# Patient Record
Sex: Female | Born: 1935 | Race: White | Hispanic: No | Smoking: Former smoker
Health system: Southern US, Community
[De-identification: ages and names within clinical notes are randomized; demographics above are authoritative.]

## PROBLEM LIST (undated history)

## (undated) DIAGNOSIS — M1990 Unspecified osteoarthritis, unspecified site: Secondary | ICD-10-CM

## (undated) DIAGNOSIS — I10 Essential (primary) hypertension: Secondary | ICD-10-CM

## (undated) DIAGNOSIS — E7800 Pure hypercholesterolemia, unspecified: Secondary | ICD-10-CM

## (undated) DIAGNOSIS — K219 Gastro-esophageal reflux disease without esophagitis: Secondary | ICD-10-CM

## (undated) HISTORY — PX: EYE SURGERY: SHX253

## (undated) HISTORY — PX: APPENDECTOMY: SHX54

## (undated) HISTORY — PX: TOTAL SHOULDER ARTHROPLASTY: SHX126

## (undated) HISTORY — PX: CHOLECYSTECTOMY: SHX55

---

## 2012-01-11 ENCOUNTER — Ambulatory Visit: Payer: Self-pay | Admitting: Internal Medicine

## 2012-02-02 ENCOUNTER — Encounter: Payer: Self-pay | Admitting: Internal Medicine

## 2012-02-16 ENCOUNTER — Encounter: Payer: Self-pay | Admitting: Internal Medicine

## 2012-05-18 ENCOUNTER — Inpatient Hospital Stay: Payer: Self-pay | Admitting: Internal Medicine

## 2012-05-18 LAB — URINALYSIS, COMPLETE
Bilirubin,UR: NEGATIVE
Blood: NEGATIVE
Glucose,UR: NEGATIVE mg/dL (ref 0–75)
Leukocyte Esterase: NEGATIVE
Nitrite: NEGATIVE
Protein: NEGATIVE
Specific Gravity: 1.01 (ref 1.003–1.030)
Squamous Epithelial: NONE SEEN

## 2012-05-18 LAB — BASIC METABOLIC PANEL
Anion Gap: 9 (ref 7–16)
BUN: 24 mg/dL — ABNORMAL HIGH (ref 7–18)
Calcium, Total: 9.1 mg/dL (ref 8.5–10.1)
Chloride: 105 mmol/L (ref 98–107)
Creatinine: 0.77 mg/dL (ref 0.60–1.30)
EGFR (African American): >60
EGFR (Non-African Amer.): >60
Glucose: 106 mg/dL — ABNORMAL HIGH (ref 65–99)
Osmolality: 284 (ref 275–301)

## 2012-05-18 LAB — CBC
HGB: 12.7 g/dL (ref 12.0–16.0)
MCH: 30.9 pg (ref 26.0–34.0)
MCV: 90 fL (ref 80–100)
RBC: 4.12 10*6/uL (ref 3.80–5.20)
WBC: 5.7 10*3/uL (ref 3.6–11.0)

## 2012-05-18 LAB — TROPONIN I: Troponin-I: <0.02 ng/mL

## 2012-05-18 LAB — CK TOTAL AND CKMB (NOT AT ARMC): CK-MB: 1.3 ng/mL (ref 0.5–3.6)

## 2012-05-19 ENCOUNTER — Ambulatory Visit: Payer: Self-pay | Admitting: Neurology

## 2012-05-19 LAB — BASIC METABOLIC PANEL
BUN: 21 mg/dL — ABNORMAL HIGH (ref 7–18)
Chloride: 109 mmol/L — ABNORMAL HIGH (ref 98–107)
Co2: 27 mmol/L (ref 21–32)
Creatinine: 0.67 mg/dL (ref 0.60–1.30)
EGFR (Non-African Amer.): >60
Osmolality: 290 (ref 275–301)
Potassium: 3.6 mmol/L (ref 3.5–5.1)
Sodium: 144 mmol/L (ref 136–145)

## 2012-05-19 LAB — CBC WITH DIFFERENTIAL/PLATELET
Basophil #: 0 10*3/uL (ref 0.0–0.1)
Basophil %: 0.4 %
Eosinophil #: 0.1 10*3/uL (ref 0.0–0.7)
Eosinophil %: 0.8 %
HCT: 34.8 % — ABNORMAL LOW (ref 35.0–47.0)
HGB: 12 g/dL (ref 12.0–16.0)
Lymphocyte #: 1.4 10*3/uL (ref 1.0–3.6)
Lymphocyte %: 17.3 %
MCHC: 34.4 g/dL (ref 32.0–36.0)
MCV: 91 fL (ref 80–100)
Neutrophil #: 5.5 10*3/uL (ref 1.4–6.5)
Neutrophil %: 69.3 %
RDW: 12.9 % (ref 11.5–14.5)

## 2012-05-19 LAB — CK TOTAL AND CKMB (NOT AT ARMC)
CK, Total: 139 U/L (ref 21–215)
CK-MB: 1.8 ng/mL (ref 0.5–3.6)

## 2012-05-19 LAB — LIPID PANEL
Ldl Cholesterol, Calc: 112 mg/dL — ABNORMAL HIGH (ref 0–100)
Triglycerides: 103 mg/dL (ref 0–200)
VLDL Cholesterol, Calc: 21 mg/dL (ref 5–40)

## 2012-05-19 LAB — TROPONIN I: Troponin-I: <0.02 ng/mL

## 2012-05-30 ENCOUNTER — Ambulatory Visit: Payer: Self-pay | Admitting: Internal Medicine

## 2012-10-05 ENCOUNTER — Ambulatory Visit: Payer: Self-pay | Admitting: Internal Medicine

## 2013-01-02 ENCOUNTER — Other Ambulatory Visit: Payer: Self-pay | Admitting: Internal Medicine

## 2013-01-02 LAB — COMPREHENSIVE METABOLIC PANEL
Albumin: 4.1 g/dL (ref 3.4–5.0)
Alkaline Phosphatase: 53 U/L (ref 50–136)
Anion Gap: 2 — ABNORMAL LOW (ref 7–16)
Chloride: 108 mmol/L — ABNORMAL HIGH (ref 98–107)
Co2: 32 mmol/L (ref 21–32)
Creatinine: 0.75 mg/dL (ref 0.60–1.30)
Glucose: 102 mg/dL — ABNORMAL HIGH (ref 65–99)
Osmolality: 287 (ref 275–301)
Potassium: 3.7 mmol/L (ref 3.5–5.1)
SGPT (ALT): 18 U/L (ref 12–78)

## 2013-01-02 LAB — CBC WITH DIFFERENTIAL/PLATELET
Basophil #: 0 10*3/uL (ref 0.0–0.1)
Basophil %: 0.6 %
Eosinophil %: 2.9 %
HCT: 37.4 % (ref 35.0–47.0)
Lymphocyte #: 1.7 10*3/uL (ref 1.0–3.6)
Lymphocyte %: 25.8 %
MCH: 31.2 pg (ref 26.0–34.0)
MCV: 90 fL (ref 80–100)
Neutrophil #: 4.1 10*3/uL (ref 1.4–6.5)

## 2013-01-02 LAB — LIPID PANEL
Cholesterol: 169 mg/dL (ref 0–200)
Ldl Cholesterol, Calc: 96 mg/dL (ref 0–100)
VLDL Cholesterol, Calc: 20 mg/dL (ref 5–40)

## 2013-01-02 LAB — HEMOGLOBIN A1C: Hemoglobin A1C: 5.4 % (ref 4.2–6.3)

## 2013-07-22 ENCOUNTER — Other Ambulatory Visit: Payer: Self-pay | Admitting: Rheumatology

## 2013-07-22 LAB — SYNOVIAL CELL COUNT + DIFF, W/ CRYSTALS
Basophil: 0 %
Crystals, Joint Fluid: NONE SEEN
EOS PCT: 0 %
LYMPHS PCT: 3 %
Neutrophils: 88 %
Nucleated Cell Count: 4209 /mm3
Other Cells BF: 0 %
Other Mononuclear Cells: 9 %

## 2014-11-04 NOTE — Consult Note (Signed)
Brief Consult Note: Diagnosis: Left distal radius fracture; Right distal radius fracture.   Patient was seen by consultant.   Consult note dictated.   Comments: Continue with splints, ice, and elevation. Will repeat radiographs in 1 week.  Electronic Signatures: Donato HeinzHooten, James P (MD)  (Signed [phone]-Nov-13 21:54)  Authored: Brief Consult Note   Last Updated: 01-Nov-13 21:54 by Donato HeinzHooten, James P (MD)

## 2014-11-04 NOTE — H&P (Signed)
PATIENT NAME:  Pamela CalkinUGH, Verta MR#:  [id number] DATE OF BIRTH:  28-Apr-1926  DATE OF ADMISSION:  05/18/2012  PRIMARY CARE PHYSICIAN: Veneda MelterMaureen Andreassi, MD     REFERRING PHYSICIAN:   Governor Rooksebecca Lord, MD    CHIEF COMPLAINT: The patient was found facing down on the sidewalk with subsequent confusion here in the ED.   HISTORY OF PRESENT ILLNESS: The patient is an 81 year old white female who resides by herself. Her daughter is here at the bedside and reports that she is in a good state of health and is able to take care of herself, including she does her own cooking, cleaning, etc. Her only medical problems are that she has a history of osteoarthritis and was recently diagnosed with high blood pressure. She was in her normal state of health when her daughter called her last night. The patient apparently went outside today and was found on the concrete floor face down by her neighbors. She was brought to the ED, and she has since been confused. She is able to recall her daughter's name and her granddaughter's name, but she has other things that she is confused about.  She is not sure where she is. She is not sure of what year it is or what the date is.  She is denying any visual difficulties, no speech difficulties. She is denying any asymmetrical weakness, denies any numbness. The patient did have a CT scan of the head which was negative. There is no other eyewitness to tell us what exactly happened. The patient also has not been recently sick, according to the daughter, and has not complained of anything significant.   PAST MEDICAL HISTORY:  1. Osteoarthritis.  2. History of recently diagnosed hypertension.  3. Hyperlipidemia.  4. Osteoarthritis.   PAST SURGICAL HISTORY:  1. Titanium left shoulder replacement.  2. Status post cholecystectomy.  3. Status post tonsillectomy.  4. Status post right wrist surgery.  5. Status post cataract surgery.  ALLERGIES: None.   CURRENT MEDICATIONS: She is just on  medication for cholesterol, however, we do not have the name of the medication.   SOCIAL HISTORY: History of smoking, quit a long time ago. She drinks 1 or 2 beers yearly. No drug use.   FAMILY HISTORY: Unknown.   REVIEW OF SYSTEMS: The patient is currently confused, unable to obtain any review of systems.   PHYSICAL EXAMINATION:  VITAL SIGNS: Temperature 98, pulse 74, respirations 18, blood pressure 162/65, O2 97%.   GENERAL: The patient is a thin Caucasian female currently not in any acute distress.   HEENT: She has some laceration on her forehead and laceration under her chin. Left pupil, she has a lens in place and is not reactive. Right pupil is equally round, reactive to light and accommodation. Extraocular movements are intact. There is no conjunctival pallor. No scleral icterus. Nasal exam shows no drainage or ulceration. Oropharynx is clear without any exudate.   NECK: No thyromegaly. No carotid bruits.   CARDIOVASCULAR: Regular rate and rhythm. No murmurs, rubs, clicks, or gallops. PMI is not displaced.   LUNGS: Clear to auscultation bilaterally without any rales, rhonchi, or wheezing.   ABDOMEN: Soft, nontender, nondistended. Positive bowel sounds x4.   EXTREMITIES: No clubbing, cyanosis, or edema.   SKIN: No rash.   LYMPHATICS: No lymph nodes palpable.   VASCULAR: Good DP pulses.  PSYCHIATRIC: The patient is not anxious or depressed.   NEUROLOGICAL: Cranial nerves II through XII are grossly intact. Strength is five out of five  in all four extremities, although because of the splint to the left upper extremity she is not able to squeeze my hand but is able to lift her arm. Lower extremities: She has got also five out of five strength. Reflexes are 2+. Babinski's are downgoing. Sensation is intact.   LABORATORY, DIAGNOSTIC AND RADIOLOGICAL DATA: In the ED so far: Her WBC count is 5.7, hemoglobin 12.7, platelet count 135. Troponin less than 0.02. CPK 49. CK-MB 1.3. BMP:  Glucose 106, BUN 24, creatinine 0.77, sodium 140, potassium 3.8, chloride 105, CO2 26. Urinalysis: Nitrites negative, leukocytes negative. Left wrist shows distal radius fracture, age indeterminate. CT scan of the C-spine negative for any abnormalities. CT scan of the head without contrast showed no evidence of acute abnormality. Diffuse mild area of low attenuation within the subcortical deep and periventricular white matter regions.   ASSESSMENT AND PLAN: The patient is an 81 year old white female with a history of hyperlipidemia, recent diagnosis of hypertension, was found on the floor by her neighbor and now has some confusion.   1. Loss of consciousness: Differential diagnosis includes possible CVA. We will go ahead and get an MRI of the brain although she does not have any other significant deficits on physical exam.  There is also concern for possible syncope. If it was syncope, her mental status should have returned back to normal. However, we will place her on telemetry for further evaluation. We will also get an echocardiogram of the heart. Other differentials also include possible arrhythmias. We will monitor her on telemetry for any evidence of any arrhythmias. Other differential includes possible seizure. We will get an EEG and Neurology evaluation.  2. Hypertension: Blood pressure is currently elevated. Due to possible CVA, we will allow permissive hypertension.  3. Hyperlipidemia: We will start her on Lipitor, check a fasting lipid panel in the a.m.  4. Left distal radius fracture, currently splinted in place: We will ask Orthopedics for further recommendations.  5. Prophylaxis: I will place her on Lovenox for deep vein thrombosis prophylaxis.   TIME SPENT:   35 minutes. ____________________________ Lacie Scotts Allena Katz, MD shp:cbb D: 05/18/2012 17:00:00 ET T: 05/18/2012 17:17:50 ET JOB#: [id number] cc: Marisue Humble P. Alison Murray, MD Charise Carwin MD ELECTRONICALLY SIGNED 05/22/2012 17:29

## 2014-11-04 NOTE — Consult Note (Signed)
PATIENT NAME:  Pamela Shaffer, Pamela Shaffer MR#:  [id number] DATE OF BIRTH:  Apr 18, 1926  DATE OF CONSULTATION:  05/19/2012  REFERRING PHYSICIAN:   CONSULTING PHYSICIAN:  Pauletta Browns, MD  HISTORY OF PRESENT ILLNESS: This is a pleasant 81 year old female. Information was obtained from the patient and the patient's daughter and granddaughter who are present at bedside. The patient was in good health, living by herself, and was admitted yesterday post-fall.  Her only past medical history includes osteoarthritis and recently diagnosed hypertension for which she was started on medications for.  The patient was in normal health yesterday, states she woke up, did not eat her breakfast, but she usually does not eat her breakfast, and remembers sometime in the afternoon she had to send out two checks. She remembers signing those checks, putting them in envelopes, then she states she tried to go outside and send those checks out. The next thing she remembers was being at the hospital. The patient's daughter states that the patient was found down on the ground by the patient's neighbor. It was unknown the duration of her being found down. The patient does not report any signs prior to the fall, no reports of dizziness, no reports of confusion, headaches. There was no report of seizure-like activity by the patient's neighbors. There was no urinary incontinence and there was no tongue biting. As per the patient's family, it appears that the patient's current medical condition and her mental status are back to her baseline despite her bilateral wrist fracture and hematoma around her left eye. There was no prior history of similar falls in the past.  PAST MEDICAL HISTORY:  1. Osteoarthritis. 2. Recently diagnosed hypertension. 3. Hyperlipidemia.  PAST SURGICAL HISTORY:  1. Right wrist surgery status post tonsillectomy. 2. Left shoulder replacement. 3. Cataract surgery.  DRUG ALLERGIES: No known drug allergies.   MEDICATIONS:  Medication for hypercholesterolemia and hypertension.  SOCIAL HISTORY: No recent smoking history. Occasional beer drinker. Lives alone. Able to clean, cook, dress herself without any assistance and talks to family almost daily by telephone.   PHYSICAL EXAMINATION:   VITALS: Temperature 98, blood pressure 163/65, pulse oximetry 92%.  NEURO: The patient is alert, awake, and oriented to time, place, and location. She was able to tell me the president of the [place], the [address] of the [place], and the [address] of [place]. She was able to do serial sevens. She was able to recall three objects within 10 seconds and two out of three within 3 minutes. She was able to name different objects that I was showing her. On cranial nerve examination, extraocular movements are intact. The face appears to have very minimal left facial droop, but according to the family this is chronic. No hearing deficits. Tongue is midline. Palate elevates symmetrically. On motor examination, tone is within normal limits. Strength appears to be 5 out of 5 bilateral in upper and lower extremities. Sensation is intact to light touch and pain as well as temperature. Finger to nose intact. No coordination issues. Gait not assessed.   LABS: In reviewing her labs, her white blood cell count is 7.9, hemoglobin and hematocrit 12 and 34.8, and platelet count 129. The rest of her electrolytes such as sodium is 144, potassium 3.6, BUN 21, and creatinine 0.67. Glucose is 1.9. Troponin was negative.   IMAGING: The patient had CAT scan of the head that did not show any acute abnormalities.   She had an ultrasound of the carotids that did not show any  hemodynamic stenosis.   MRI of the brain was performed and on preliminary results there is no acute ischemia. Multiple T2 flair white matter changes that are consistent with hypertension.   An electroencephalogram was done which did not show seizure activity.    ASSESSMENT: An 81 year old Caucasian female with history of hyperlipidemia, osteoarthritis, and newly diagnosed hypertension status post fall with suspected loss of consciousness found down by her neighbor for undetermined period of time. The patient appears to be at her baseline right now and she is found to have bilateral wrist fractures for which she is in splints. She also has mild left periorbital swelling and some hematoma.  At this point, stroke has been ruled out. EEG was done and she is less likely to have suspected seizure activity because there was no witnessed shaking activity, the patient did not have any tongue biting, urinary incontinence, and EEG that was performed is completely normal.   RECOMMENDATIONS/PLAN: I would not recommend any further neurological workup at this time, but would recommend cardiovascular workup such as echocardiogram, EKG, and Holter monitor to look for any possible arrhythmias such as atrial fibrillation and continue telemetry monitoring. If such symptoms were to reoccur, and neurological workup is negative, would like to have prolonged EEG monitoring and possibly start her on an antiepileptic medication. Thank you. It was a pleasure seeing this patient.  ____________________________ Pauletta BrownsYuriy Tyrae Alcoser, MD yz:slb D: 05/19/2012 15:02:00 ET T: 05/19/2012 15:12:26 ET JOB#: [id number]  cc: Pauletta BrownsYuriy Fontaine Kossman, MD, <Dictator> Pauletta BrownsYURIY Bonnell Placzek MD ELECTRONICALLY SIGNED 07/03/2012 19:07

## 2014-11-04 NOTE — Discharge Summary (Signed)
PATIENT NAME:  Pamela CalkinUGH, [name] MR#:  [id number] DATE OF BIRTH:  [birth year]  DATE OF ADMISSION:  05/18/2012 DATE OF DISCHARGE:  05/20/2012  DIAGNOSES:  1. Syncope versus mechanical fall.  2. Hypertension.  3. Hyperlipidemia.  4. Bilateral radial fractures.   DISPOSITION: The patient is being discharged home with home health.   DIET: Low sodium.   ACTIVITY: As tolerated.   FOLLOWUP: Follow up with Dr. Ernest PineHooten in 1 week of discharge. Follow up with PCP Dr. Alison MurrayAndreassi in 1 to 2 weeks after discharge.   DISCHARGE MEDICATIONS:  1. Lovastatin 20 mg daily.  2. Meloxicam 7.5 mg daily.  3. P.r.n. Percocet 5/325 one tablet q.6 hours p.r.n.   CONSULTATIONS:  1. Neurology consultation with Dr. Loretha BrasilZeylikman. 2. Orthopedic consultation with Dr. Ernest PineHooten.  RESULTS: MRI of the brain showed no acute abnormalities. Carotid ultrasound showed no hemodynamically significant stenosis. CAT scan of the head showed no acute problems. Bilateral wrist x-ray showed bilateral radial fractures. Echocardiogram: Normal LV function, EF more than 55%, normal left ventricular wall thickness, left ventricular wall function is normal. Urinalysis showed no evidence of infection. CBC normal other than platelet count of 135,000. Complete metabolic panel normal. Cardiac enzymes normal.   HOSPITAL COURSE: The patient is an 81 year old female with history of hypertension not on any medications, hyperlipidemia, who was walking to post her letters when she apparently either tripped and fell and hit her head or passed out. The patient was found by her neighbor who called EMS. The etiology was unclear. Differential diagnosis includes CVA which was ruled out by negative CT head and MRI of the brain. Carotid ultrasound also showed no stenosis. EEG showed no seizure activity. Cardiac enzymes were checked and were normal.  On telemetry patient had no arrhythmias. A neurology consultation was obtained, and no further intervention was recommended for  the time being. The patient has had a history of hypertension and she reported that stress causes her blood pressure to go up but her blood pressure was overall very variable. Therefore her PCP has decided not to start her on any medications. She has a history of hyperlipidemia. Her LDL was 112. She is on a statin therapy. The patient was found to have bilateral radial fractures and was evaluated by Dr. Ernest PineHooten who placed her wrists in a splint. He also recommended ice and elevation. Patient will follow up with Dr. Ernest PineHooten within one week to have follow-up x-rays and possible cast placement. Home health RN and aide have been arranged for the patient. She is being discharged home in a stable condition.   TIME SPENT: 45 minutes.    ____________________________ Darrick MeigsSangeeta Iliyah Bui, MD sp:vtd D: 05/21/2012 13:55:27 ET T: 05/21/2012 14:05:51 ET JOB#: [id number]  cc: Darrick MeigsSangeeta Tammera Engert, MD, <Dictator> Maureen P. Alison MurrayAndreassi, MD Darrick MeigsSANGEETA Vearl Aitken MD ELECTRONICALLY SIGNED 05/21/2012 14:25

## 2014-11-04 NOTE — Consult Note (Signed)
PATIENT NAME:  Pamela Shaffer, Pamela Shaffer MR#:  [id number] DATE OF BIRTH:  05-05-1926  DATE OF CONSULTATION:  05/18/2012  REFERRING PHYSICIAN:  Auburn BilberryShreyang Patel, MD CONSULTING PHYSICIAN:  Illene LabradorJames P. Angie FavaHooten Jr., MD  CHIEF COMPLAINT: Bilateral wrist pain.   HISTORY OF PRESENT ILLNESS: The patient is an 81 year old female who apparently went outside earlier today and sustained a fall. She was found on the ground facedown by one of her neighbors. The patient does not recall the events of the fall. She was noted to be confused at the time of presentation and has continued to the demonstrate some confusion since admission. She complained of some bilateral wrist pain. Radiographs obtained in the emergency department demonstrated findings consistent with bilateral distal radius fractures. Splints were applied in the emergency department.   PAST MEDICAL HISTORY:  1. Degenerative arthrosis. 2. Hypertension.  3. Hyperlipidemia.  4. Status post left shoulder arthroplasty.  5. Cholecystectomy.  6. Tonsillectomy. 7. Right wrist surgery. 8. Cataract surgery.   ALLERGIES: No known drug allergies.   CURRENT MEDICATIONS: Lovastatin 20 mg daily.   SOCIAL HISTORY: Remote history of tobacco use. Minimal use of alcohol. No drug use. The patient lives alone but has family locally.   FAMILY HISTORY: Noncontributory.   REVIEW OF SYSTEMS: The patient was confused and was unable to provide an appropriate review of systems.   PHYSICAL EXAMINATION:   GENERAL: The patient is an elderly, well developed well nourished female seen in no acute distress. She is awake and alert.   HEENT: Lacerations are noted to the head and chin. Some surrounding ecchymosis is noted. Oropharynx is clear.   NECK: Supple and nontender with good range of motion.   PULMONARY: Lungs are clear to auscultation bilaterally.   CARDIAC: Regular rate and rhythm. Good capillary refill was noted to the upper extremities.   EXTREMITIES: Short arm volar  splints are in place to both forearms. Some ecchymosis is noted along the base of the thumbs. Changes consistent with osteoarthritis of digits are noted bilaterally. There is tenderness to palpation about both wrists. Mild swelling of the digits is appreciated. Elbows are nontender to palpation. Good range of motion of both elbows is appreciated.   NEUROLOGIC: The patient is awake and alert, but somewhat confused. Sensory function is grossly intact. Motor strength is intact. Good motor coordination.   X-RAYS: [facility]. There is nondisplaced fracture of the distal radius. Good alignment was noted.   Radiographs of the left wrist were also reviewed. There is a comminuted fracture of the left distal radius. Some displacement is noted.   IMPRESSION:  1. Right distal radius fracture.  2. Left distal radius fracture, comminuted.   PLAN: Findings were discussed with the patient. I would agree with continuation of splint immobilization of both wrists. She is encouraged to work on range of motion of the digits.     Continue with cold therapy to minimize swelling. I anticipate repeating radiographs in approximately one week. I would anticipate converting the splints to casts after swelling has resolved.  ____________________________ Illene LabradorJames P. Angie FavaHooten Jr., MD jph:slb D: 05/18/2012 22:04:00 ET T: 05/19/2012 10:43:30 ET JOB#: [id number]  cc: Fayrene FearingJames P. Angie FavaHooten Jr., MD, <Dictator> Illene LabradorJAMES P Angie FavaHOOTEN JR MD ELECTRONICALLY SIGNED 05/20/2012 6:40

## 2015-07-06 ENCOUNTER — Other Ambulatory Visit: Payer: Self-pay | Admitting: Internal Medicine

## 2015-07-06 DIAGNOSIS — I6523 Occlusion and stenosis of bilateral carotid arteries: Secondary | ICD-10-CM

## 2015-07-09 ENCOUNTER — Ambulatory Visit
Admission: RE | Admit: 2015-07-09 | Discharge: 2015-07-09 | Disposition: A | Discharge status: Home / Self Care | Payer: Medicare Other | Source: Ambulatory Visit | Attending: Internal Medicine | Admitting: Internal Medicine

## 2015-07-09 DIAGNOSIS — I6523 Occlusion and stenosis of bilateral carotid arteries: Secondary | ICD-10-CM | POA: Diagnosis not present

## 2016-10-23 ENCOUNTER — Other Ambulatory Visit: Payer: Self-pay | Admitting: Specialist

## 2016-10-26 ENCOUNTER — Inpatient Hospital Stay: Admission: RE | Admit: 2016-10-26 | Payer: Medicare Other | Source: Ambulatory Visit

## 2016-10-28 ENCOUNTER — Encounter
Admission: RE | Admit: 2016-10-28 | Discharge: 2016-10-28 | Disposition: A | Discharge status: Home / Self Care | Payer: Medicare Other | Source: Ambulatory Visit | Attending: Specialist | Admitting: Specialist

## 2016-10-28 ENCOUNTER — Other Ambulatory Visit: Payer: Self-pay | Admitting: Specialist

## 2016-10-28 DIAGNOSIS — M1711 Unilateral primary osteoarthritis, right knee: Secondary | ICD-10-CM | POA: Insufficient documentation

## 2016-10-28 DIAGNOSIS — Z0183 Encounter for blood typing: Secondary | ICD-10-CM | POA: Insufficient documentation

## 2016-10-28 DIAGNOSIS — Z01812 Encounter for preprocedural laboratory examination: Secondary | ICD-10-CM | POA: Diagnosis present

## 2016-10-28 HISTORY — DX: Pure hypercholesterolemia, unspecified: E78.00

## 2016-10-28 HISTORY — DX: Essential (primary) hypertension: I10

## 2016-10-28 HISTORY — DX: Unspecified osteoarthritis, unspecified site: M19.90

## 2016-10-28 HISTORY — DX: Gastro-esophageal reflux disease without esophagitis: K21.9

## 2016-10-28 LAB — URINALYSIS, COMPLETE (UACMP) WITH MICROSCOPIC
BACTERIA UA: NONE SEEN
BILIRUBIN URINE: NEGATIVE
Glucose, UA: NEGATIVE mg/dL
HGB URINE DIPSTICK: NEGATIVE
KETONES UR: NEGATIVE mg/dL
LEUKOCYTES UA: NEGATIVE
NITRITE: NEGATIVE
Protein, ur: NEGATIVE mg/dL
SPECIFIC GRAVITY, URINE: 1.006 (ref 1.005–1.030)
Squamous Epithelial / LPF: NONE SEEN
pH: 6 (ref 5.0–8.0)

## 2016-10-28 LAB — DIFFERENTIAL
BASOS PCT: 1 %
Basophils Absolute: 0 10*3/uL (ref 0–0.1)
EOS ABS: 0.1 10*3/uL (ref 0–0.7)
Eosinophils Relative: 2 %
Lymphocytes Relative: 18 %
Lymphs Abs: 1.4 10*3/uL (ref 1.0–3.6)
MONO ABS: 0.5 10*3/uL (ref 0.2–0.9)
MONOS PCT: 7 %
NEUTROS ABS: 5.4 10*3/uL (ref 1.4–6.5)
Neutrophils Relative %: 72 %

## 2016-10-28 LAB — CBC
HEMATOCRIT: 39.3 % (ref 35.0–47.0)
HEMOGLOBIN: 13.3 g/dL (ref 12.0–16.0)
MCH: 30.3 pg (ref 26.0–34.0)
MCHC: 33.8 g/dL (ref 32.0–36.0)
MCV: 89.7 fL (ref 80.0–100.0)
PLATELETS: 191 10*3/uL (ref 150–440)
RBC: 4.38 MIL/uL (ref 3.80–5.20)
RDW: 14 % (ref 11.5–14.5)
WBC: 7.5 10*3/uL (ref 3.6–11.0)

## 2016-10-28 LAB — TYPE AND SCREEN
ABO/RH(D): A POS
ANTIBODY SCREEN: NEGATIVE

## 2016-10-28 LAB — BASIC METABOLIC PANEL
Anion gap: 7 (ref 5–15)
BUN: 19 mg/dL (ref 6–20)
CALCIUM: 9.4 mg/dL (ref 8.9–10.3)
CO2: 28 mmol/L (ref 22–32)
CREATININE: 0.55 mg/dL (ref 0.44–1.00)
Chloride: 105 mmol/L (ref 101–111)
GFR calc Af Amer: >60 mL/min (ref >60)
GLUCOSE: 115 mg/dL — AB (ref 65–99)
POTASSIUM: 3.8 mmol/L (ref 3.5–5.1)
SODIUM: 140 mmol/L (ref 135–145)

## 2016-10-28 LAB — PROTIME-INR
INR: 0.97
PROTHROMBIN TIME: 12.9 s (ref 11.4–15.2)

## 2016-10-28 LAB — SURGICAL PCR SCREEN
MRSA, PCR: NEGATIVE
STAPHYLOCOCCUS AUREUS: POSITIVE — AB

## 2016-10-28 NOTE — Patient Instructions (Signed)
  Your procedure is scheduled on: November 02, 2016 (Wednesday) Report to Same Day Surgery 2nd floor medical mall [facility] Entrance-take elevator on left to 2nd floor.  Check in with surgery information desk.) To find out your arrival time please call [phone] between 1PM - 3PM on November 01, 2016 (Tuesday)   Remember: Instructions that are not followed completely may result in serious medical risk, up to and including death, or upon the discretion of your surgeon and anesthesiologist your surgery may need to be rescheduled.    _x___ 1. Do not eat food or drink liquids after midnight. No gum chewing or hard candies                       __x__ 2. No Alcohol for 24 hours before or after surgery.   __x__3. No Smoking for 24 prior to surgery.   ____  4. Bring all medications with you on the day of surgery if instructed.    __x__ 5. Notify your doctor if there is any change in your medical condition     (cold, fever, infections).     Do not wear jewelry, make-up, hairpins, clips or nail polish.  Do not wear lotions, powders, or perfumes.  Do not shave 48 hours prior to surgery. Men may shave face and neck.  Do not bring valuables to the hospital.    [facility] is not responsible for any belongings or valuables.               Contacts, dentures or bridgework may not be worn into surgery.  Leave your suitcase in the car. After surgery it may be brought to your room.  For patients admitted to the hospital, discharge time is determined by your  treatment team                      Patients discharged the day of surgery will not be allowed to drive home.  You will need someone to drive you home and stay with you the night of your procedure.    Please read over the following fact sheets that you were given:   [facility] Preparing for Surgery and or MRSA Information   _x___ Take anti-hypertensive (unless it includes a diuretic), cardiac, seizure, asthma,     anti-reflux and psychiatric  medicines with a sip of water These include:  1. GABAPENTIN  2. OMEPRAZOLE  3.  4.  5.  6.  ____Fleets enema or Magnesium Citrate as directed.   _x___ Use CHG Soap or sage wipes as directed on instruction sheet   ____ Use inhalers on the day of surgery and bring to hospital day of surgery  ____ Stop Metformin and Janumet 2 days prior to surgery.    ____ Take 1/2 of usual insulin dose the night before surgery and none on the morning     surgery.   _x___ Follow recommendations from Cardiologist, Pulmonologist or PCP regarding          stopping Aspirin, Coumadin, Pllavix ,Eliquis, Effient, or Pradaxa, and Pletal.  X____Stop Anti-inflammatories such as Advil, Aleve, Ibuprofen, Motrin, Naproxen, Naprosyn, Goodies powders or aspirin products. OK to take Tylenol (STOP DICLOFENAC NOW)   _x___ Stop supplements until after surgery.  But may continue Vitamin D, Vitamin B,       and multivitamin.   ____ Bring C-Pap to the hospital.

## 2016-10-28 NOTE — Pre-Procedure Instructions (Signed)
Component Name Value Ref Range  Vent Rate (bpm) 81   PR Interval (msec) 188   QRS Interval (msec) 126   QT Interval (msec) 412   QTc (msec) 478   Result Narrative  Normal sinus rhythm Right bundle branch block Moderate voltage criteria for LVH, may be normal variant Abnormal ECG When compared with ECG of 23-Dec-2015 10:01, Nonspecific T wave abnormality has replaced inverted T waves in Inferior leads I reviewed and concur with this report. Electronically signed [id number] MD, Smitty Cords [phone]) on 09/26/2016 2:17:32 PM  Status Results Details

## 2016-10-29 LAB — URINE CULTURE

## 2016-10-31 NOTE — Pre-Procedure Instructions (Signed)
Positive Staph and Urine culture results faxed to Dr. Hyacinth Meeker office.

## 2016-11-01 MED ORDER — LACTATED RINGERS IV SOLN
INTRAVENOUS | Status: DC
  Administered 2016-11-02: 07:00:00 via INTRAVENOUS

## 2016-11-02 ENCOUNTER — Ambulatory Visit: Payer: Medicare Other | Admitting: Anesthesiology

## 2016-11-02 ENCOUNTER — Encounter
Admission: RE | Disposition: A | Discharge status: Home / Self Care | Payer: Self-pay | Source: Ambulatory Visit | Attending: Specialist

## 2016-11-02 ENCOUNTER — Inpatient Hospital Stay
Admission: RE | Admit: 2016-11-02 | Discharge: 2016-11-05 | DRG: 470 | Disposition: A | Discharge status: Home / Self Care | Payer: Medicare Other | Source: Ambulatory Visit | Attending: Specialist | Admitting: Specialist

## 2016-11-02 ENCOUNTER — Inpatient Hospital Stay: Payer: Medicare Other

## 2016-11-02 ENCOUNTER — Encounter: Payer: Self-pay | Admitting: *Deleted

## 2016-11-02 DIAGNOSIS — I10 Essential (primary) hypertension: Secondary | ICD-10-CM | POA: Diagnosis present

## 2016-11-02 DIAGNOSIS — Z87891 Personal history of nicotine dependence: Secondary | ICD-10-CM

## 2016-11-02 DIAGNOSIS — M1711 Unilateral primary osteoarthritis, right knee: Secondary | ICD-10-CM | POA: Diagnosis present

## 2016-11-02 DIAGNOSIS — K219 Gastro-esophageal reflux disease without esophagitis: Secondary | ICD-10-CM | POA: Diagnosis present

## 2016-11-02 DIAGNOSIS — Z96659 Presence of unspecified artificial knee joint: Secondary | ICD-10-CM

## 2016-11-02 HISTORY — PX: TOTAL KNEE ARTHROPLASTY: SHX125

## 2016-11-02 LAB — CBC
HCT: 32.6 % — ABNORMAL LOW (ref 35.0–47.0)
HEMOGLOBIN: 10.8 g/dL — AB (ref 12.0–16.0)
MCH: 29.8 pg (ref 26.0–34.0)
MCHC: 33 g/dL (ref 32.0–36.0)
MCV: 90.2 fL (ref 80.0–100.0)
PLATELETS: 157 10*3/uL (ref 150–440)
RBC: 3.61 MIL/uL — AB (ref 3.80–5.20)
RDW: 13.9 % (ref 11.5–14.5)
WBC: 7.9 10*3/uL (ref 3.6–11.0)

## 2016-11-02 LAB — CREATININE, SERUM
Creatinine, Ser: 0.74 mg/dL (ref 0.44–1.00)
GFR calc Af Amer: >60 mL/min (ref >60)
GFR calc non Af Amer: >60 mL/min (ref >60)

## 2016-11-02 LAB — ABO/RH: ABO/RH(D): A POS

## 2016-11-02 SURGERY — ARTHROPLASTY, KNEE, TOTAL
Anesthesia: Spinal | Site: Knee | Laterality: Right | Wound class: Clean

## 2016-11-02 MED ORDER — MORPHINE SULFATE (PF) 4 MG/ML IV SOLN
INTRAVENOUS | Status: DC | PRN
  Administered 2016-11-02: 4 mg

## 2016-11-02 MED ORDER — PROPOFOL 10 MG/ML IV BOLUS
INTRAVENOUS | Status: AC
  Filled 2016-11-02: qty 20

## 2016-11-02 MED ORDER — CALCIUM CARBONATE-VITAMIN D 500-200 MG-UNIT PO TABS
2.0000 | ORAL_TABLET | Freq: Every day | ORAL | Status: DC
  Administered 2016-11-03 – 2016-11-05 (×3): 2 via ORAL
  Filled 2016-11-02 (×3): qty 2

## 2016-11-02 MED ORDER — NALBUPHINE HCL 10 MG/ML IJ SOLN
5.0000 mg | INTRAMUSCULAR | Status: DC | PRN
  Filled 2016-11-02: qty 0.5

## 2016-11-02 MED ORDER — ACETAMINOPHEN 500 MG PO TABS
1000.0000 mg | ORAL_TABLET | Freq: Four times a day (QID) | ORAL | Status: AC
  Administered 2016-11-03 (×2): 1000 mg via ORAL
  Filled 2016-11-02 (×3): qty 2

## 2016-11-02 MED ORDER — SODIUM CHLORIDE 0.9 % IV SOLN
INTRAVENOUS | Status: DC | PRN
  Administered 2016-11-02: 60 mL

## 2016-11-02 MED ORDER — NALBUPHINE HCL 10 MG/ML IJ SOLN
5.0000 mg | Freq: Once | INTRAMUSCULAR | Status: DC | PRN
  Filled 2016-11-02: qty 0.5

## 2016-11-02 MED ORDER — SENNA 8.6 MG PO TABS
1.0000 | ORAL_TABLET | Freq: Two times a day (BID) | ORAL | Status: DC
  Administered 2016-11-02 – 2016-11-05 (×6): 8.6 mg via ORAL
  Filled 2016-11-02 (×6): qty 1

## 2016-11-02 MED ORDER — DIPHENHYDRAMINE HCL 12.5 MG/5ML PO ELIX: 12.5000 mg | ORAL_SOLUTION | ORAL | Status: DC | PRN

## 2016-11-02 MED ORDER — DEXAMETHASONE SODIUM PHOSPHATE 10 MG/ML IJ SOLN
INTRAMUSCULAR | Status: AC
  Filled 2016-11-02: qty 1

## 2016-11-02 MED ORDER — SODIUM CHLORIDE FLUSH 0.9 % IV SOLN
INTRAVENOUS | Status: AC
  Filled 2016-11-02: qty 3

## 2016-11-02 MED ORDER — PANTOPRAZOLE SODIUM 40 MG PO TBEC
40.0000 mg | DELAYED_RELEASE_TABLET | Freq: Every day | ORAL | Status: DC
  Administered 2016-11-02 – 2016-11-05 (×4): 40 mg via ORAL
  Filled 2016-11-02 (×4): qty 1

## 2016-11-02 MED ORDER — CHLORHEXIDINE GLUCONATE CLOTH 2 % EX PADS: 6.0000 | MEDICATED_PAD | Freq: Once | CUTANEOUS | Status: DC

## 2016-11-02 MED ORDER — PROPOFOL 10 MG/ML IV BOLUS
INTRAVENOUS | Status: DC | PRN
  Administered 2016-11-02: 10 mg via INTRAVENOUS

## 2016-11-02 MED ORDER — NALOXONE HCL 0.4 MG/ML IJ SOLN: 0.4000 mg | INTRAMUSCULAR | Status: DC | PRN

## 2016-11-02 MED ORDER — METOCLOPRAMIDE HCL 5 MG/ML IJ SOLN: 5.0000 mg | Freq: Three times a day (TID) | INTRAMUSCULAR | Status: DC | PRN

## 2016-11-02 MED ORDER — IBUPROFEN 400 MG PO TABS: 600.0000 mg | ORAL_TABLET | Freq: Four times a day (QID) | ORAL | Status: DC | PRN

## 2016-11-02 MED ORDER — OXYCODONE HCL 5 MG PO TABS
5.0000 mg | ORAL_TABLET | ORAL | Status: DC | PRN
  Administered 2016-11-03: 5 mg via ORAL
  Administered 2016-11-03: 10 mg via ORAL
  Administered 2016-11-03 (×2): 5 mg via ORAL
  Administered 2016-11-03 – 2016-11-04 (×5): 10 mg via ORAL
  Administered 2016-11-05 (×2): 5 mg via ORAL
  Filled 2016-11-02 (×3): qty 1
  Filled 2016-11-02: qty 2
  Filled 2016-11-02: qty 1
  Filled 2016-11-02: qty 2
  Filled 2016-11-02: qty 1
  Filled 2016-11-02 (×4): qty 2
  Filled 2016-11-02: qty 1

## 2016-11-02 MED ORDER — METOCLOPRAMIDE HCL 10 MG PO TABS: 5.0000 mg | ORAL_TABLET | Freq: Three times a day (TID) | ORAL | Status: DC | PRN

## 2016-11-02 MED ORDER — FENTANYL CITRATE (PF) 100 MCG/2ML IJ SOLN
INTRAMUSCULAR | Status: AC
  Filled 2016-11-02: qty 2

## 2016-11-02 MED ORDER — PROPOFOL 500 MG/50ML IV EMUL
INTRAVENOUS | Status: AC
  Filled 2016-11-02: qty 50

## 2016-11-02 MED ORDER — PHENOL 1.4 % MT LIQD
1.0000 | OROMUCOSAL | Status: DC | PRN
  Filled 2016-11-02: qty 177

## 2016-11-02 MED ORDER — TETRACAINE HCL 1 % IJ SOLN
INTRAMUSCULAR | Status: DC | PRN
  Administered 2016-11-02: 3 mg via INTRASPINAL

## 2016-11-02 MED ORDER — CALCIUM CARBONATE-VITAMIN D 600-400 MG-UNIT PO TABS: 2.0000 | ORAL_TABLET | Freq: Every day | ORAL | Status: DC

## 2016-11-02 MED ORDER — SUCCINYLCHOLINE CHLORIDE 20 MG/ML IJ SOLN
INTRAMUSCULAR | Status: AC
  Filled 2016-11-02: qty 1

## 2016-11-02 MED ORDER — NEOMYCIN-POLYMYXIN B GU 40-200000 IR SOLN
Status: DC | PRN
  Administered 2016-11-02: 16 mL

## 2016-11-02 MED ORDER — EPHEDRINE SULFATE 50 MG/ML IJ SOLN
INTRAMUSCULAR | Status: AC
  Filled 2016-11-02: qty 1

## 2016-11-02 MED ORDER — MAGNESIUM HYDROXIDE 400 MG/5ML PO SUSP
30.0000 mL | Freq: Every day | ORAL | Status: DC | PRN
  Administered 2016-11-04: 30 mL via ORAL
  Filled 2016-11-02: qty 30

## 2016-11-02 MED ORDER — MORPHINE SULFATE (PF) 0.5 MG/ML IJ SOLN
INTRAMUSCULAR | Status: DC | PRN
  Administered 2016-11-02: .1 mg via EPIDURAL

## 2016-11-02 MED ORDER — PREGABALIN 75 MG PO CAPS
75.0000 mg | ORAL_CAPSULE | Freq: Two times a day (BID) | ORAL | Status: DC
  Administered 2016-11-02: 75 mg via ORAL

## 2016-11-02 MED ORDER — MORPHINE SULFATE (PF) 2 MG/ML IV SOLN
1.0000 mg | INTRAVENOUS | Status: AC | PRN
Start: 2016-11-02 — End: 2016-11-03

## 2016-11-02 MED ORDER — PROPOFOL 500 MG/50ML IV EMUL
INTRAVENOUS | Status: DC | PRN
  Administered 2016-11-02: 40 ug/kg/min via INTRAVENOUS

## 2016-11-02 MED ORDER — FLEET ENEMA 7-19 GM/118ML RE ENEM: 1.0000 | ENEMA | Freq: Once | RECTAL | Status: DC | PRN

## 2016-11-02 MED ORDER — FENTANYL CITRATE (PF) 100 MCG/2ML IJ SOLN: 25.0000 ug | INTRAMUSCULAR | Status: DC | PRN

## 2016-11-02 MED ORDER — BISACODYL 10 MG RE SUPP: 10.0000 mg | Freq: Every day | RECTAL | Status: DC | PRN

## 2016-11-02 MED ORDER — AMLODIPINE BESYLATE 5 MG PO TABS
2.5000 mg | ORAL_TABLET | Freq: Every day | ORAL | Status: DC
  Administered 2016-11-03 – 2016-11-04 (×2): 2.5 mg via ORAL
  Filled 2016-11-02 (×3): qty 1

## 2016-11-02 MED ORDER — TRANEXAMIC ACID 1000 MG/10ML IV SOLN
1000.0000 mg | Freq: Once | INTRAVENOUS | Status: AC
  Administered 2016-11-02: 1000 mg via INTRAVENOUS
  Filled 2016-11-02: qty 10

## 2016-11-02 MED ORDER — ONDANSETRON HCL 4 MG/2ML IJ SOLN
INTRAMUSCULAR | Status: AC
  Filled 2016-11-02: qty 2

## 2016-11-02 MED ORDER — NALOXONE HCL 2 MG/2ML IJ SOSY
1.0000 ug/kg/h | PREFILLED_SYRINGE | INTRAVENOUS | Status: DC | PRN
  Filled 2016-11-02: qty 2

## 2016-11-02 MED ORDER — KETOROLAC TROMETHAMINE 30 MG/ML IJ SOLN
INTRAMUSCULAR | Status: AC
  Filled 2016-11-02: qty 1

## 2016-11-02 MED ORDER — GABAPENTIN 100 MG PO CAPS
100.0000 mg | ORAL_CAPSULE | Freq: Three times a day (TID) | ORAL | Status: DC
  Administered 2016-11-02 – 2016-11-05 (×9): 100 mg via ORAL
  Filled 2016-11-02 (×9): qty 1

## 2016-11-02 MED ORDER — CELECOXIB 200 MG PO CAPS
200.0000 mg | ORAL_CAPSULE | Freq: Two times a day (BID) | ORAL | Status: DC
  Administered 2016-11-02 – 2016-11-05 (×6): 200 mg via ORAL
  Filled 2016-11-02 (×7): qty 1

## 2016-11-02 MED ORDER — ENOXAPARIN SODIUM 30 MG/0.3ML ~~LOC~~ SOLN
30.0000 mg | SUBCUTANEOUS | Status: DC
  Administered 2016-11-03 – 2016-11-05 (×3): 30 mg via SUBCUTANEOUS
  Filled 2016-11-02 (×3): qty 0.3

## 2016-11-02 MED ORDER — SODIUM CHLORIDE 0.9% FLUSH: 3.0000 mL | INTRAVENOUS | Status: DC | PRN

## 2016-11-02 MED ORDER — LACTATED RINGERS IV SOLN
INTRAVENOUS | Status: DC | PRN
  Administered 2016-11-02: 08:00:00 via INTRAVENOUS

## 2016-11-02 MED ORDER — NEOMYCIN-POLYMYXIN B GU 40-200000 IR SOLN
Status: AC
  Filled 2016-11-02: qty 20

## 2016-11-02 MED ORDER — BUPIVACAINE LIPOSOME 1.3 % IJ SUSP
INTRAMUSCULAR | Status: AC
  Filled 2016-11-02: qty 20

## 2016-11-02 MED ORDER — DIPHENHYDRAMINE HCL 50 MG/ML IJ SOLN: 12.5000 mg | INTRAMUSCULAR | Status: DC | PRN

## 2016-11-02 MED ORDER — ONDANSETRON HCL 4 MG PO TABS: 4.0000 mg | ORAL_TABLET | Freq: Four times a day (QID) | ORAL | Status: DC | PRN

## 2016-11-02 MED ORDER — SODIUM CHLORIDE 0.9 % IV SOLN
1000.0000 mg | INTRAVENOUS | Status: DC
  Filled 2016-11-02 (×2): qty 10

## 2016-11-02 MED ORDER — LACTATED RINGERS IV SOLN: INTRAVENOUS | Status: DC

## 2016-11-02 MED ORDER — VANCOMYCIN HCL IN DEXTROSE 1-5 GM/200ML-% IV SOLN
1000.0000 mg | Freq: Two times a day (BID) | INTRAVENOUS | Status: AC
  Administered 2016-11-02 – 2016-11-03 (×2): 1000 mg via INTRAVENOUS
  Filled 2016-11-02 (×2): qty 200

## 2016-11-02 MED ORDER — TRANEXAMIC ACID 1000 MG/10ML IV SOLN
INTRAVENOUS | Status: AC
  Filled 2016-11-02: qty 10

## 2016-11-02 MED ORDER — BUPIVACAINE HCL (PF) 0.25 % IJ SOLN
INTRAMUSCULAR | Status: AC
  Filled 2016-11-02: qty 30

## 2016-11-02 MED ORDER — VANCOMYCIN HCL 10 G IV SOLR
1500.0000 mg | INTRAVENOUS | Status: AC
  Administered 2016-11-02: 1500 mg via INTRAVENOUS
  Filled 2016-11-02: qty 1500

## 2016-11-02 MED ORDER — SODIUM CHLORIDE 0.9 % IV SOLN
INTRAVENOUS | Status: DC | PRN
  Administered 2016-11-02: 1000 mg via INTRAVENOUS

## 2016-11-02 MED ORDER — ONDANSETRON HCL 4 MG/2ML IJ SOLN: 4.0000 mg | Freq: Once | INTRAMUSCULAR | Status: DC | PRN

## 2016-11-02 MED ORDER — KETOROLAC TROMETHAMINE 30 MG/ML IJ SOLN
INTRAMUSCULAR | Status: DC | PRN
  Administered 2016-11-02: 30 mg

## 2016-11-02 MED ORDER — DIPHENHYDRAMINE HCL 25 MG PO CAPS: 25.0000 mg | ORAL_CAPSULE | ORAL | Status: DC | PRN

## 2016-11-02 MED ORDER — PHENYLEPHRINE HCL 10 MG/ML IJ SOLN
INTRAMUSCULAR | Status: AC
  Filled 2016-11-02: qty 1

## 2016-11-02 MED ORDER — PRAVASTATIN SODIUM 10 MG PO TABS
10.0000 mg | ORAL_TABLET | Freq: Every day | ORAL | Status: DC
Start: 2016-11-02 — End: 2016-11-05
  Administered 2016-11-02 – 2016-11-04 (×3): 10 mg via ORAL
  Filled 2016-11-02 (×3): qty 1

## 2016-11-02 MED ORDER — ONDANSETRON HCL 4 MG/2ML IJ SOLN
INTRAMUSCULAR | Status: DC | PRN
  Administered 2016-11-02: 4 mg via INTRAVENOUS

## 2016-11-02 MED ORDER — MORPHINE SULFATE (PF) 4 MG/ML IV SOLN
INTRAVENOUS | Status: AC
  Filled 2016-11-02: qty 1

## 2016-11-02 MED ORDER — MENTHOL 3 MG MT LOZG
1.0000 | LOZENGE | OROMUCOSAL | Status: DC | PRN
  Filled 2016-11-02: qty 9

## 2016-11-02 MED ORDER — ACETAMINOPHEN 325 MG PO TABS
650.0000 mg | ORAL_TABLET | Freq: Four times a day (QID) | ORAL | Status: DC | PRN
  Administered 2016-11-05: 650 mg via ORAL
  Filled 2016-11-02 (×2): qty 2

## 2016-11-02 MED ORDER — CELECOXIB 200 MG PO CAPS
400.0000 mg | ORAL_CAPSULE | ORAL | Status: AC
  Administered 2016-11-02: 400 mg via ORAL

## 2016-11-02 MED ORDER — CELECOXIB 200 MG PO CAPS: 200.0000 mg | ORAL_CAPSULE | Freq: Two times a day (BID) | ORAL | Status: DC

## 2016-11-02 MED ORDER — MORPHINE SULFATE (PF) 2 MG/ML IV SOLN: 1.0000 mg | INTRAVENOUS | Status: DC | PRN

## 2016-11-02 MED ORDER — SODIUM CHLORIDE 0.45 % IV SOLN
INTRAVENOUS | Status: DC
  Administered 2016-11-02: 18:00:00 via INTRAVENOUS

## 2016-11-02 MED ORDER — BUPIVACAINE HCL (PF) 0.5 % IJ SOLN
INTRAMUSCULAR | Status: DC | PRN
  Administered 2016-11-02: 2 mL

## 2016-11-02 MED ORDER — MEPERIDINE HCL 25 MG/ML IJ SOLN: 6.2500 mg | INTRAMUSCULAR | Status: DC | PRN

## 2016-11-02 MED ORDER — ONDANSETRON HCL 4 MG/2ML IJ SOLN: 4.0000 mg | Freq: Four times a day (QID) | INTRAMUSCULAR | Status: DC | PRN

## 2016-11-02 MED ORDER — ONDANSETRON HCL 4 MG/2ML IJ SOLN: 4.0000 mg | Freq: Three times a day (TID) | INTRAMUSCULAR | Status: DC | PRN

## 2016-11-02 MED ORDER — ACETAMINOPHEN 650 MG RE SUPP: 650.0000 mg | Freq: Four times a day (QID) | RECTAL | Status: DC | PRN

## 2016-11-02 MED ORDER — ZOLPIDEM TARTRATE 5 MG PO TABS: 5.0000 mg | ORAL_TABLET | Freq: Every evening | ORAL | Status: DC | PRN

## 2016-11-02 MED ORDER — MORPHINE SULFATE (PF) 0.5 MG/ML IJ SOLN
INTRAMUSCULAR | Status: AC
  Filled 2016-11-02: qty 10

## 2016-11-02 MED ORDER — SCOPOLAMINE 1 MG/3DAYS TD PT72
1.0000 | MEDICATED_PATCH | Freq: Once | TRANSDERMAL | Status: DC
Start: 2016-11-02 — End: 2016-11-05
  Filled 2016-11-02: qty 1

## 2016-11-02 MED ORDER — BUPIVACAINE HCL (PF) 0.25 % IJ SOLN
INTRAMUSCULAR | Status: DC | PRN
  Administered 2016-11-02: 30 mL

## 2016-11-02 MED ORDER — CELECOXIB 200 MG PO CAPS
ORAL_CAPSULE | ORAL | Status: AC
  Filled 2016-11-02: qty 2

## 2016-11-02 MED ORDER — METHOCARBAMOL 500 MG PO TABS: 500.0000 mg | ORAL_TABLET | Freq: Four times a day (QID) | ORAL | Status: DC | PRN

## 2016-11-02 MED ORDER — SODIUM CHLORIDE 0.9 % IJ SOLN
INTRAMUSCULAR | Status: AC
  Filled 2016-11-02: qty 100

## 2016-11-02 MED ORDER — GLYCOPYRROLATE 0.2 MG/ML IJ SOLN
INTRAMUSCULAR | Status: AC
  Filled 2016-11-02: qty 1

## 2016-11-02 MED ORDER — ALUM & MAG HYDROXIDE-SIMETH 200-200-20 MG/5ML PO SUSP: 30.0000 mL | ORAL | Status: DC | PRN

## 2016-11-02 MED ORDER — FERROUS SULFATE 325 (65 FE) MG PO TABS
325.0000 mg | ORAL_TABLET | Freq: Three times a day (TID) | ORAL | Status: DC
  Administered 2016-11-02 – 2016-11-05 (×6): 325 mg via ORAL
  Filled 2016-11-02 (×6): qty 1

## 2016-11-02 MED ORDER — METHOCARBAMOL 1000 MG/10ML IJ SOLN
500.0000 mg | Freq: Four times a day (QID) | INTRAVENOUS | Status: DC | PRN
  Filled 2016-11-02: qty 5

## 2016-11-02 MED ORDER — PREGABALIN 75 MG PO CAPS
ORAL_CAPSULE | ORAL | Status: AC
  Filled 2016-11-02: qty 1

## 2016-11-02 MED ORDER — SODIUM FLUORIDE 1.1 % DT CREA: 1.0000 "application " | TOPICAL_CREAM | Freq: Every day | DENTAL | Status: DC

## 2016-11-02 SURGICAL SUPPLY — 51 items
AUTOTRANSFUS HAS 1/8 (MISCELLANEOUS) ×3
BLADE DEBAKEY 8.0 (BLADE) ×4 IMPLANT
BLADE DEBAKEY 8.0MM (BLADE) ×2
BLADE SAGITTAL WIDE XTHICK NO (BLADE) ×3 IMPLANT
CANISTER SUCT 1200ML W/VALVE (MISCELLANEOUS) ×3 IMPLANT
CANISTER SUCT 3000ML (MISCELLANEOUS) ×3 IMPLANT
CAP KNEE TOTAL 3 SIGMA ×3 IMPLANT
CATH TRAY METER 16FR LF (MISCELLANEOUS) ×3 IMPLANT
CEMENT HV SMART SET (Cement) ×6 IMPLANT
CHLORAPREP W/TINT 26ML (MISCELLANEOUS) ×6 IMPLANT
COOLER POLAR GLACIER W/PUMP (MISCELLANEOUS) ×3 IMPLANT
CUFF TOURN 24 STER (MISCELLANEOUS) IMPLANT
CUFF TOURN 30 STER DUAL PORT (MISCELLANEOUS) ×3 IMPLANT
DRAPE INCISE IOBAN 66X60 STRL (DRAPES) ×3 IMPLANT
DRAPE SHEET LG 3/4 BI-LAMINATE (DRAPES) ×6 IMPLANT
DRSG AQUACEL AG ADV 3.5X10 (GAUZE/BANDAGES/DRESSINGS) ×3 IMPLANT
DRSG AQUACEL AG ADV 3.5X14 (GAUZE/BANDAGES/DRESSINGS) ×3 IMPLANT
ELECT REM PT RETURN 9FT ADLT (ELECTROSURGICAL) ×3
ELECTRODE REM PT RTRN 9FT ADLT (ELECTROSURGICAL) ×1 IMPLANT
GLOVE BIO SURGEON STRL SZ7.5 (GLOVE) ×3 IMPLANT
GLOVE BIO SURGEON STRL SZ8 (GLOVE) ×3 IMPLANT
GLOVE BIOGEL PI IND STRL 8.5 (GLOVE) ×1 IMPLANT
GLOVE BIOGEL PI INDICATOR 8.5 (GLOVE) ×2
GLOVE INDICATOR 8.0 STRL GRN (GLOVE) ×3 IMPLANT
GLOVE SURG ORTHO 8.5 STRL (GLOVE) ×3 IMPLANT
GOWN STRL REUS W/ TWL LRG LVL4 (GOWN DISPOSABLE) ×1 IMPLANT
GOWN STRL REUS W/TWL LRG LVL4 (GOWN DISPOSABLE) ×5 IMPLANT
IMMBOLIZER KNEE 19 BLUE UNIV (SOFTGOODS) ×3 IMPLANT
KIT RM TURNOVER STRD PROC AR (KITS) ×3 IMPLANT
NEEDLE SPNL 20GX3.5 QUINCKE YW (NEEDLE) ×3 IMPLANT
NS IRRIG 1000ML POUR BTL (IV SOLUTION) ×3 IMPLANT
PACK TOTAL KNEE (MISCELLANEOUS) ×3 IMPLANT
PAD WRAPON POLAR KNEE (MISCELLANEOUS) ×1 IMPLANT
PULSAVAC PLUS IRRIG FAN TIP (DISPOSABLE) ×3
SOL .9 NS 3000ML IRR  AL (IV SOLUTION) ×2
SOL .9 NS 3000ML IRR UROMATIC (IV SOLUTION) ×1 IMPLANT
SPONGE LAP 18X18 5 PK (GAUZE/BANDAGES/DRESSINGS) IMPLANT
STAPLER SKIN PROX 35W (STAPLE) ×3 IMPLANT
SUCTION FRAZIER HANDLE 10FR (MISCELLANEOUS) ×2
SUCTION TUBE FRAZIER 10FR DISP (MISCELLANEOUS) ×1 IMPLANT
SUT BONE WAX W31G (SUTURE) ×3 IMPLANT
SUT DVC 2 QUILL PDO  T11 36X36 (SUTURE) ×2
SUT DVC 2 QUILL PDO T11 36X36 (SUTURE) ×1 IMPLANT
SUT QUILL PDO 0 36 36 VIOLET (SUTURE) ×3 IMPLANT
SYR 20CC LL (SYRINGE) ×9 IMPLANT
SYSTEM AUTOTRANSFUS DUAL TROCR (MISCELLANEOUS) ×1 IMPLANT
TAPE MICROFOAM 4IN (TAPE) ×3 IMPLANT
TIP FAN IRRIG PULSAVAC PLUS (DISPOSABLE) ×1 IMPLANT
TOWER CARTRIDGE SMART MIX (DISPOSABLE) ×3 IMPLANT
TUBE SUCT KAM VAC (TUBING) ×3 IMPLANT
WRAPON POLAR PAD KNEE (MISCELLANEOUS) ×3

## 2016-11-02 NOTE — Op Note (Signed)
DATE OF SURGERY:  11/02/2016 TIME: 10:20 AM  PATIENT NAME:  Pamela Shaffer   AGE: 83 y.o.    PRE-OPERATIVE DIAGNOSIS:  M17.11 Unilateral primary osteoarthritis, right knee  POST-OPERATIVE DIAGNOSIS:  Same  PROCEDURE:  Procedure(s): TOTAL KNEE ARTHROPLASTY RIGHT KNEE   SURGEON:  Obrian Bulson E, MD   ASSISTANT:   OPERATIVE IMPLANTS: Depuy LCS Femur/Patella size STANDARD +, Tibia size #4,  Rotating platform polyethylene size 12.5 MM    Total tourniquet time was 106 minutes.  PREOPERATIVE INDICATIONS:  Avira Tillison is a 83 y.o. year old female with end stage bone on bone degenerative arthritis of the knee who failed conservative treatment, including injections, antiinflammatories, activity modification, and assistive devices, and had significant impairment of their activities of daily living, and elected for Total Knee Arthroplasty.   The risks, benefits, and alternatives were discussed at length including but not limited to the risks of infection, bleeding, nerve injury, stiffness, blood clots, the need for revision surgery, cardiopulmonary complications, among others, and they were willing to proceed.  OPERATIVE FINDINGS AND UNIQUE ASPECTS OF THE CASE:  ADVANCED OSTEOARTHRITIS THROUGHOUT THE KNEE  OPERATIVE DESCRIPTION:   The patient was brought to the operative room and placed in a supine position. Spinal anesthesia was administered. IV VANCOMYCIN antibiotics were given. The lower extremity was prepped and draped in the usual sterile fashion. Time out was performed. The leg was elevated and exsanguinated and the tourniquet was inflated to 350 mmHg  An anterior midline incision was made.  Anterior quadriceps tendon splitting approach was performed. The patella was everted and osteophytes were removed. The anterior horn of the medial and lateral meniscus was removed.  Then the extramedullary tibial cutting jig was utilized making the appropriate cut using the anterior tibial crest as a  reference building in appropriate posterior slope. Care was taken during the cut to protect the medial and collateral ligaments. The proximal tibia was removed along with the posterior horns of the menisci. The PCL was sacrificed.  The distal femur was sized as above . Medial release was carried out. The anterior femoral cutting guide was aligned and centering hole made. The rotation guide was inserted and the anterior cutting guide pinned in place, and was in excellent alignment. The posterior femoral cuts were made. The flexion gap was established. The distal femoral cutting guide was introduced at 4 of valgus. This was pinned and the distal femoral cut made. The extension gap was established and was stable. The finishing guide was applied and finishing cuts made. The Mchale retractor was inserted and the keeled tibial trial was pinned in place. Centering hole was made and the keel inserted. The femoral component was inserted along with a polyethylene  insert and the knee articulated.  Extension and flexion showed good stability throughout. The patella was then sized and cut made for the patellar component. Centering holes were made. The trial was inserted and the knee articulated nicely with no need for lateral release. The trials were all removed and the knee thoroughly irrigated with pulsed lavage. Exparil was injected. The knee was dried and the cement mixed. The  keeled tibial component,  femoral component and patellar components were all cemented in place and excess cement was removed. The cement was allowed to harden for 10 minutes. Further irrigation was carried out. Bone wax was applied to all raw bony surfaces. Autovac drains were inserted. Quarter percent plain Marcaine, Toradol and morphine were injected. The capsule was closed with #2 Quill suture, and the subcutaneous  tissues were closed with 0 Quill suture. The skin was closed with staples.Sponge and needle counts were correct.  Aquacel dressing  with TENS pads and a dry sterile dressing were applied. Polar Care and knee immobilizer were applied. Tourniquet was deflated with excellent return of blood flow to foot. Patient was transferred to a hospital bed and taken to the recovery room in good condition.  Valinda Hoar, MD

## 2016-11-02 NOTE — Anesthesia Post-op Follow-up Note (Cosign Needed)
Anesthesia QCDR form completed.        

## 2016-11-02 NOTE — Progress Notes (Signed)
Patient was admitted from OR via room bed. A&O x4. Knee immobilizer, TENs unit, foley, Hemovac, NSL in place. Bed alarm on for safety. Daughter at bedside. Reviewed POC and advanced diet to clear liquid.

## 2016-11-02 NOTE — Progress Notes (Signed)
Discarded 150cc from autovac

## 2016-11-02 NOTE — Anesthesia Preprocedure Evaluation (Signed)
Anesthesia Evaluation  Patient identified by MRN, date of birth, ID band Patient awake    Reviewed: Allergy & Precautions, NPO status , Patient's Chart, lab work & pertinent test results  Airway Mallampati: II  TM Distance: <3 FB     Dental  (+) Caps   Pulmonary former smoker,    Pulmonary exam normal        Cardiovascular hypertension, Pt. on medications Normal cardiovascular exam     Neuro/Psych negative neurological ROS  negative psych ROS   GI/Hepatic Neg liver ROS, GERD  Medicated,  Endo/Other  negative endocrine ROS  Renal/GU negative Renal ROS  negative genitourinary   Musculoskeletal  (+) Arthritis , Osteoarthritis,    Abdominal Normal abdominal exam  (+)   Peds negative pediatric ROS (+)  Hematology negative hematology ROS (+)   Anesthesia Other Findings   Reproductive/Obstetrics                            Anesthesia Physical Anesthesia Plan  ASA: III  Anesthesia Plan: Spinal   Post-op Pain Management:    Induction: Intravenous  Airway Management Planned: Nasal Cannula  Additional Equipment:   Intra-op Plan:   Post-operative Plan:   Informed Consent: I have reviewed the patients History and Physical, chart, labs and discussed the procedure including the risks, benefits and alternatives for the proposed anesthesia with the patient or authorized representative who has indicated his/her understanding and acceptance.     Plan Discussed with: CRNA and Surgeon  Anesthesia Plan Comments:         Anesthesia Quick Evaluation

## 2016-11-02 NOTE — Progress Notes (Signed)
Stacy from TENS unit in to apply unit

## 2016-11-02 NOTE — Transfer of Care (Signed)
Immediate Anesthesia Transfer of Care Note  Patient: Pamela Shaffer  Procedure(s) Performed: Procedure(s): TOTAL KNEE ARTHROPLASTY (Right)  Patient Location: PACU  Anesthesia Type:Spinal  Level of Consciousness: awake, alert , oriented and patient cooperative  Airway & Oxygen Therapy: Patient Spontanous Breathing and Patient connected to nasal cannula oxygen  Post-op Assessment: Report given to RN and Post -op Vital signs reviewed and stable  Post vital signs: Reviewed and stable  Last Vitals:  Vitals:   11/02/16 0610  BP: (!) 144/82  Pulse: 98  Resp: 16  Temp: 36.7 C    Last Pain:  Vitals:   11/02/16 0610  TempSrc: Tympanic  PainSc: 5          Complications: No apparent anesthesia complications

## 2016-11-02 NOTE — Progress Notes (Signed)
Called dr Pernell Dupre blood pressure 93/78   No new orders  Pt doing very well

## 2016-11-02 NOTE — H&P (Signed)
THE PATIENT WAS SEEN PRIOR TO SURGERY TODAY.  HISTORY, ALLERGIES, HOME MEDICATIONS AND OPERATIVE PROCEDURE WERE REVIEWED. RISKS AND BENEFITS OF SURGERY DISCUSSED WITH PATIENT AGAIN.  NO CHANGES FROM INITIAL HISTORY AND PHYSICAL NOTED.    

## 2016-11-02 NOTE — Anesthesia Procedure Notes (Signed)
Spinal  Patient location during procedure: OR Start time: 11/02/2016 7:48 AM End time: 11/02/2016 7:52 AM Staffing Anesthesiologist: Yves Dill Performed: anesthesiologist  Preanesthetic Checklist Completed: patient identified, site marked, surgical consent, pre-op evaluation, timeout performed, IV checked, risks and benefits discussed and monitors and equipment checked Spinal Block Patient position: sitting Prep: Betadine Patient monitoring: heart rate, cardiac monitor, continuous pulse ox and blood pressure Approach: midline Location: L3-4 Injection technique: single-shot Needle Needle type: Quincke  Needle gauge: 25 G Assessment Sensory level: T10 Additional Notes Time out called.  Patient placed in sitting position.  Back prepped and draped in sterile fashion.  A skin wheal was made in the L3-L4 interspace with 1% Lidocaine plain.  A 25 G Quincke needle was used with the return of clear, colorless CSF in all 4 quadrants.  No blood or paresthesias.  Patient tolerated the procedure well.  10 mg of marcaine +  of tetracaine + 0.1 mg of astromorph was injected.

## 2016-11-02 NOTE — NC FL2 (Addendum)
[place] MEDICAID FL2 LEVEL OF CARE SCREENING TOOL     IDENTIFICATION  Patient Name: Pamela Shaffer Birthdate: 01-09-1926 Sex: female Admission Date (Current Location): 11/02/2016  [place] and [place] Number:  Chiropodist and Address:  [facility], [address], [place], [place] [address]      Provider Number: [id number]  Attending Physician Name and Address:  Deeann Saint, MD  Relative Name and Phone Number:       Current Level of Care: Hospital Recommended Level of Care: Skilled Nursing Facility Prior Approval Number:    Date Approved/Denied:   PASRR Number:  [id number])  Discharge Plan: SNF    Current Diagnoses: Patient Active Problem List   Diagnosis Date Noted  . Total knee replacement status 11/02/2016    Orientation RESPIRATION BLADDER Height & Weight     Self, Time, Situation, Place  O2 (2 Liters Oxygen ) Continent Weight: 140 lb (63.5 kg) Height:   (157.5 cm)  BEHAVIORAL SYMPTOMS/MOOD NEUROLOGICAL BOWEL NUTRITION STATUS   (none)  (none) Continent Diet (Regular Diet )  AMBULATORY STATUS COMMUNICATION OF NEEDS Skin   Extensive Assist Verbally Surgical wounds (Incision: Right Knee )                       Personal Care Assistance Level of Assistance  Bathing, Feeding, Dressing Bathing Assistance: Limited assistance Feeding assistance: Independent Dressing Assistance: Limited assistance     Functional Limitations Info  Sight, Hearing, Speech Sight Info: Adequate Hearing Info: Adequate Speech Info: Adequate    SPECIAL CARE FACTORS FREQUENCY  PT (By licensed PT), OT (By licensed OT)     PT Frequency:  (5) OT Frequency:  (5)            Contractures      Additional Factors Info  Code Status, Allergies Code Status Info:  (Full Code. ) Allergies Info:  (No Known Allergies. )           Current Medications (11/02/2016):  This is the current hospital active medication list Current  Facility-Administered Medications  Medication Dose Route Frequency Provider Last Rate Last Dose  . 0.45 % sodium chloride infusion   Intravenous Continuous Deeann Saint, MD      . acetaminophen (TYLENOL) tablet 650 mg  650 mg Oral Q6H PRN Deeann Saint, MD       Or  . acetaminophen (TYLENOL) suppository 650 mg  650 mg Rectal Q6H PRN Deeann Saint, MD      . acetaminophen (TYLENOL) tablet 1,000 mg  1,000 mg Oral Q6H Deeann Saint, MD      . alum & mag hydroxide-simeth (MAALOX/MYLANTA) 200-200-20 MG/5ML suspension 30 mL  30 mL Oral Q4H PRN Deeann Saint, MD      . amLODipine (NORVASC) tablet 2.5 mg  2.5 mg Oral QHS Deeann Saint, MD      . bisacodyl (DULCOLAX) suppository 10 mg  10 mg Rectal Daily PRN Deeann Saint, MD      . Melene Muller ON 11/03/2016] Calcium Carbonate-Vitamin D 600-400 MG-UNIT 2 tablet  2 tablet Oral Q1200 Deeann Saint, MD      . celecoxib (CELEBREX) 200 MG capsule           . celecoxib (CELEBREX) capsule 200 mg  200 mg Oral Q12H Deeann Saint, MD      . celecoxib (CELEBREX) capsule 200 mg  200 mg Oral BID Deeann Saint, MD      . diphenhydrAMINE (BENADRYL) 12.5 MG/5ML elixir 12.5-25 mg  12.5-25 mg Oral Q4H PRN Deeann Saint, MD      . Melene Muller ON 11/03/2016] enoxaparin (LOVENOX) injection 30 mg  30 mg Subcutaneous Q24H Deeann Saint, MD      . ferrous sulfate tablet 325 mg  325 mg Oral TID PC Deeann Saint, MD      . gabapentin (NEURONTIN) capsule 100 mg  100 mg Oral TID Deeann Saint, MD      . magnesium hydroxide (MILK OF MAGNESIA) suspension 30 mL  30 mL Oral Daily PRN Deeann Saint, MD      . menthol-cetylpyridinium (CEPACOL) lozenge 3 mg  1 lozenge Oral PRN Deeann Saint, MD       Or  . phenol (CHLORASEPTIC) mouth spray 1 spray  1 spray Mouth/Throat PRN Deeann Saint, MD      . methocarbamol (ROBAXIN) tablet 500 mg  500 mg Oral Q6H PRN Deeann Saint, MD       Or  . methocarbamol (ROBAXIN) 500 mg in dextrose 5 % 50 mL IVPB  500 mg Intravenous Q6H PRN Deeann Saint, MD      .  metoCLOPramide (REGLAN) tablet 5-10 mg  5-10 mg Oral Q8H PRN Deeann Saint, MD       Or  . metoCLOPramide (REGLAN) injection 5-10 mg  5-10 mg Intravenous Q8H PRN Deeann Saint, MD      . morphine 2 MG/ML injection 1 mg  1 mg Intravenous Q2H PRN Deeann Saint, MD      . ondansetron [facility]) tablet 4 mg  4 mg Oral Q6H PRN Deeann Saint, MD       Or  . ondansetron [facility]) injection 4 mg  4 mg Intravenous Q6H PRN Deeann Saint, MD      . oxyCODONE (Oxy IR/ROXICODONE) immediate release tablet 5-10 mg  5-10 mg Oral Q3H PRN Deeann Saint, MD      . pantoprazole (PROTONIX) EC tablet 40 mg  40 mg Oral Daily Deeann Saint, MD      . pravastatin (PRAVACHOL) tablet 10 mg  10 mg Oral q1800 Deeann Saint, MD      . pregabalin (LYRICA) 75 MG capsule           . senna (SENOKOT) tablet 8.6 mg  1 tablet Oral BID Deeann Saint, MD      . sodium chloride flush 0.9 % injection           . sodium fluoride (PREVIDENT 5000 PLUS) 1.1 % dental cream 1 application  1 application Oral QHS Deeann Saint, MD      . sodium phosphate (FLEET) 7-19 GM/118ML enema 1 enema  1 enema Rectal Once PRN Deeann Saint, MD      . vancomycin (VANCOCIN) IVPB 1000 mg/200 mL premix  1,000 mg Intravenous Q12H Deeann Saint, MD      . zolpidem Remus Loffler) tablet 5 mg  5 mg Oral QHS PRN Deeann Saint, MD         Discharge Medications: Please see discharge summary for a list of discharge medications.  Relevant Imaging Results:  Relevant Lab Results:   Additional Information  (SSN: [id number])  Sample, Darleen Crocker, LCSW

## 2016-11-03 LAB — BASIC METABOLIC PANEL
Anion gap: 5 (ref 5–15)
BUN: 13 mg/dL (ref 6–20)
CO2: 28 mmol/L (ref 22–32)
CREATININE: 0.64 mg/dL (ref 0.44–1.00)
Calcium: 8.4 mg/dL — ABNORMAL LOW (ref 8.9–10.3)
Chloride: 107 mmol/L (ref 101–111)
Glucose, Bld: 107 mg/dL — ABNORMAL HIGH (ref 65–99)
POTASSIUM: 3.8 mmol/L (ref 3.5–5.1)
SODIUM: 140 mmol/L (ref 135–145)

## 2016-11-03 NOTE — Progress Notes (Signed)
Subjective: 1 Day Post-Op Procedure(s) (LRB): TOTAL KNEE ARTHROPLASTY (Right)    Patient reports pain as mild.  Objective:   VITALS:   Vitals:   11/02/16 2342 11/03/16 0403  BP: (!) 126/40 (!) 139/54  Pulse:  71  Resp:  18  Temp:  97.9 F (36.6 C)    Neurologically intact ABD soft Neurovascular intact Sensation intact distally Intact pulses distally Dorsiflexion/Plantar flexion intact Incision: no drainage  LABS  Recent Labs  11/02/16 1658  HGB 10.8*  HCT 32.6*  WBC 7.9  PLT 157     Recent Labs  11/02/16 1658 11/03/16 0439  NA  --  140  K  --  3.8  BUN  --  13  CREATININE 0.74 0.64  GLUCOSE  --  107*    No results for input(s): LABPT, INR in the last 72 hours.   Assessment/Plan: 1 Day Post-Op Procedure(s) (LRB): TOTAL KNEE ARTHROPLASTY (Right)   Doing well. Continue PT Start OPPT next week

## 2016-11-03 NOTE — Evaluation (Addendum)
Occupational Therapy Evaluation Patient Details Name: Pamela Shaffer MRN: [id number] DOB: 1926/02/14 Today's Date: 11/03/2016    History of Present Illness 83 y/o female s/p R TKA 4/18. POD#1 for OT evaluation. PMHx includes GERD, HTN, arthritis, RA.   Clinical Impression   Pt seen for OT evaluation this date, POD#1. Pt was independent at baseline living alone with family nearby and plan to have daughter and granddaughter with her during the day and has arranged for [facility] to stay with her overnight. Pt presents with fatigue this date, declines out of chair activity, however agreeable to education/training in AE for LB ADL and energy conservation strategies with home/routines modifications to minimize falls risk. Pt presents with impairments in strength, ROM, pain, activity tolerance, and would benefit from skilled OT services to maximize functional return to PLOF and minimize falls risk, including continued education/training in strategies and AE for self care, home set up and modifications to maximize safety. Addendum: Of note, pt's R pupil significantly smaller than L pupil, vision screens were normal and pt reports no visual changes or deficits. RN notified.    Follow Up Recommendations  Home health OT    Equipment Recommendations  3 in 1 bedside commode    Recommendations for Other Services       Precautions / Restrictions Precautions Precautions: Fall Required Braces or Orthoses: Knee Immobilizer - Right Restrictions Weight Bearing Restrictions: Yes RLE Weight Bearing: Partial weight bearing      Mobility Bed Mobility Overal bed mobility: Modified Independent             General bed mobility comments: did not assess, pt up in recliner for session  Transfers             General transfer comment: pt declined due to fatigue after PT session    Balance                                          ADL either performed or assessed with clinical  judgement   ADL Overall ADL's : Needs assistance/impaired     Grooming: Wash/dry hands;Wash/dry face;Oral care;Set up;Sitting Grooming Details (indicate cue type and reason): pt declined attempt to stand at sink to perform grooming tasks, performed seated in recliner with set up Upper Body Bathing: Set up;Sitting   Lower Body Bathing: Set up;Sit to/from stand;Min guard   Upper Body Dressing : Set up;Sitting   Lower Body Dressing: With adaptive equipment;Sitting/lateral leans;Supervision/safety;Set up Lower Body Dressing Details (indicate cue type and reason): pt educated in use of AE for LB ADL, return demo safe technique to don/doff socks    Toilet Transfer Details (indicate cue type and reason): pt declined this session due to fatigue           General ADL Comments: pt generally supervision-min guard level for ADL     Vision   Eye Alignment: Within Functional Limits Ocular Range of Motion: Within Functional Limits Alignment/Gaze Preference: Within Defined Limits Tracking/Visual Pursuits: Able to track stimulus in all quads without difficulty Convergence: Within functional limits Additional Comments: L pupil not circular, R pupil significantly smaller, RN notified of abnormality     Perception     Praxis      Pertinent Vitals/Pain Pain Assessment: 0-10 Pain Score: 2  Pain Location: R knee at rest Pain Intervention(s): Limited activity within patient's tolerance;Monitored during session;Premedicated before session;Ice applied  Hand Dominance Right   Extremity/Trunk Assessment Upper Extremity Assessment Upper Extremity Assessment: Overall WFL for tasks assessed   Lower Extremity Assessment Lower Extremity Assessment: Defer to PT evaluation;Overall WFL for tasks assessed;RLE deficits/detail RLE Deficits / Details: Pt with decreased R LE strength as expected post surgery but was able to do 10 SLRs, showed >3/5 strength with most LE tasks and despite some pain with  PROM flexion achieved >65 degrees   Cervical / Trunk Assessment Cervical / Trunk Assessment: Normal   Communication Communication Communication: No difficulties   Cognition Arousal/Alertness: Awake/alert Behavior During Therapy: WFL for tasks assessed/performed Overall Cognitive Status: Within Functional Limits for tasks assessed                                     General Comments       Exercises Other Exercises Other Exercises: pt educated in ECS and home/routines modifications to support falls prevention and functional independence   Shoulder Instructions      Home Living Family/patient expects to be discharged to:: Private residence Living Arrangements: Alone Available Help at Discharge: Family;Personal care attendant Type of Home: House (townhome) Home Access: Level entry     Home Layout: One level     Bathroom Shower/Tub: Walk-in shower;Door   [employer] Toilet: Standard Bathroom Accessibility: Yes How Accessible: Accessible via walker Home Equipment: Walker - 4 wheels;Cane - single point;Shower seat;Grab bars - tub/shower   Additional Comments: Pt reports she will be able to have 24/7 support at home      Prior Functioning/Environment Level of Independence: Independent        Comments: Pt running all her errands, staying active, independent with all tasks; no falls         OT Problem List: Decreased strength;Pain;Decreased range of motion;Decreased activity tolerance;Decreased knowledge of use of DME or AE      OT Treatment/Interventions: Self-care/ADL training;Therapeutic exercise;Therapeutic activities;Energy conservation;DME and/or AE instruction;Patient/family education    OT Goals(Current goals can be found in the care plan section) Acute Rehab OT Goals Patient Stated Goal: Go home OT Goal Formulation: With patient Time For Goal Achievement: 11/17/16 Potential to Achieve Goals: Good  OT Frequency: Min 1X/week   Barriers to D/C:             Co-evaluation              End of Session CPM Right Knee CPM Right Knee: Off  Activity Tolerance: Patient limited by fatigue Patient left: in chair;with call bell/phone within reach;with chair alarm set;with SCD's reapplied;Other (comment) (polar care in place)  OT Visit Diagnosis: Other abnormalities of gait and mobility (R26.89);Muscle weakness (generalized) (M62.81);Pain Pain - Right/Left: Right Pain - part of body: Knee                Time: 1610-9604 OT Time Calculation (min): 38 min Charges:  OT General Charges $OT Visit: 1 Procedure OT Evaluation $OT Eval Low Complexity: 1 Procedure OT Treatments $Self Care/Home Management : 23-37 mins G-Codes:     Richrd Prime, MPH, MS, OTR/L ascom [phone] 11/03/16, 1:11 PM

## 2016-11-03 NOTE — Progress Notes (Signed)
Clinical Social Worker (CSW) received SNF consult. PT is recommending home health. RN case manager aware of above. Please reconsult if future social work needs arise. CSW signing off.   Maryann Mccall, LCSW [phone] 

## 2016-11-03 NOTE — Progress Notes (Signed)
Pt alert and oriented during the night. Tolerated sitting on side of bed. Pain controlled. Polar care and TENS in place to right knee. Tolerated CPM at 70 degrees. No complaint of nausea. Blood pressure low during the night. IV antibiotics continued. Staff will continue to monitor.

## 2016-11-03 NOTE — Anesthesia Postprocedure Evaluation (Signed)
Anesthesia Post Note  Patient: Gertha Calkin  Procedure(s) Performed: Procedure(s) (LRB): TOTAL KNEE ARTHROPLASTY (Right)  Patient location during evaluation: Nursing Unit Anesthesia Type: Spinal Level of consciousness: awake, awake and alert and oriented Pain management: pain level controlled Vital Signs Assessment: post-procedure vital signs reviewed and stable Respiratory status: spontaneous breathing, nonlabored ventilation and respiratory function stable Cardiovascular status: blood pressure returned to baseline Postop Assessment: no headache and no backache Anesthetic complications: no     Last Vitals:  Vitals:   11/02/16 2342 11/03/16 0403  BP: (!) 126/40 (!) 139/54  Pulse:  71  Resp:  18  Temp:  36.6 C    Last Pain:  Vitals:   11/03/16 0504  TempSrc:   PainSc: 1                  Ladoris Lythgoe,  Asar Evilsizer R

## 2016-11-03 NOTE — Progress Notes (Signed)
Physical Therapy Treatment Patient Details Name: Pamela Shaffer MRN: [id number] DOB: 05-27-1926 Today's Date: 11/03/2016    History of Present Illness 83 y/o female s/p R TKA 4/18. PMHx includes GERD, HTN, arthritis, RA.    PT Comments    Pt did well with ambulation and mobility this afternoon and though she has some expected post-op pain she was not limited by this and generally did well.  She was able to ambulate ~75 ft with largely consistent/constant forward motion and no safety issues, buckling, excessive fatigue or other significant limiters.  Pt eager to work with PT and was able to do most exercises against resistance and did SLRs again gravity w/o assist.     Follow Up Recommendations        Equipment Recommendations  Rolling walker with 5" wheels;3in1 (PT)    Recommendations for Other Services       Precautions / Restrictions Precautions Precautions: Fall Restrictions RLE Weight Bearing: Partial weight bearing    Mobility  Bed Mobility Overal bed mobility: Modified Independent             General bed mobility comments: Pt able to get her LEs back into bed w/o assist  Transfers Overall transfer level: Needs assistance Equipment used: Rolling walker (2 wheeled) Transfers: Sit to/from Stand Sit to Stand: Min assist         General transfer comment: Pt showed good effort in getting up to standing, did need light assist to fully shift weight forward enough to get to standing.    Ambulation/Gait Ambulation/Gait assistance: Min guard Ambulation Distance (Feet): 75 Feet Assistive device: Rolling walker (2 wheeled)       General Gait Details: Pt with much more confident ambulation this afternoon.  She still had some veering to the L multiple times t/o the effort but was able to maintain forward motion while still using walker enough to maintain PWBing. Pt did not have any knee buckling to feeling of unsteadiness with PWBing on R   Stairs             Wheelchair Mobility    Modified Rankin (Stroke Patients Only)       Balance Overall balance assessment: Modified Independent                                          Cognition Arousal/Alertness: Awake/alert Behavior During Therapy: WFL for tasks assessed/performed Overall Cognitive Status: Within Functional Limits for tasks assessed                                        Exercises Total Joint Exercises Ankle Circles/Pumps: AROM;10 reps Quad Sets: Strengthening;10 reps Gluteal Sets: Strengthening;10 reps Short Arc Quad: AROM;Strengthening;10 reps Heel Slides: 10 reps;AAROM Hip ABduction/ADduction: Strengthening;10 reps Straight Leg Raises: AROM;10 reps Knee Flexion: PROM;5 reps    General Comments        Pertinent Vitals/Pain Pain Assessment: 0-10 Pain Score: 5     Home Living                      Prior Function            PT Goals (current goals can now be found in the care plan section) Progress towards PT goals: Progressing toward goals    Frequency  BID      PT Plan Current plan remains appropriate    Co-evaluation             End of Session Equipment Utilized During Treatment: Gait belt Activity Tolerance: Patient tolerated treatment well Patient left: with bed alarm set;with call bell/phone within reach Nurse Communication: Mobility status PT Visit Diagnosis: Muscle weakness (generalized) (M62.81);Difficulty in walking, not elsewhere classified (R26.2)     Time: 1610-9604 PT Time Calculation (min) (ACUTE ONLY): 25 min  Charges:  $Gait Training: 8-22 mins $Therapeutic Exercise: 8-22 mins                    G Codes:       Malachi Pro, DPT 11/03/2016, 2:46 PM

## 2016-11-03 NOTE — Care Management (Signed)
Bundle patient. She is set up for outpatient PT starting April 23. 24 hour care has been arranged by family. Case Closed

## 2016-11-03 NOTE — Evaluation (Signed)
Physical Therapy Evaluation Patient Details Name: Pamela Shaffer MRN: [id number] DOB: 01/26/1926 Today's Date: 11/03/2016   History of Present Illness  83 y/o female s/p R TKA 4/18.  Clinical Impression  Pt did well with PT POD1 for TKA.  She showed good effort with all activities and despite some general hesitancy and guarding she showed ability to do ~13 minutes of exercises with SLRs and with some pain/effort she was able to achieve >65 degrees of flexion.  She needed considerable VCs for ambulation/walker use and with transfers, but despite some hesitancy and needing a lot of cuing she did not need any direct physical assist during the effort.     Follow Up Recommendations Home health PT    Equipment Recommendations  Rolling walker with 5" wheels;3in1 (PT)    Recommendations for Other Services       Precautions / Restrictions Precautions Precautions: Fall Required Braces or Orthoses: Knee Immobilizer - Right Restrictions RLE Weight Bearing: Partial weight bearing      Mobility  Bed Mobility Overal bed mobility: Modified Independent             General bed mobility comments: Pt needing some extra time/cuing and use of rails, but was able to get from supine to sitting EOB w/o direct assist  Transfers Overall transfer level: Modified independent Equipment used: Rolling walker (2 wheeled)             General transfer comment: Again pt needed cuing and encouragement along with cues for sequencing and positioning but overall did not need direct physical assist to assume standing from sitting (x2)  Ambulation/Gait Ambulation/Gait assistance: Min guard Ambulation Distance (Feet): 35 Feet Assistive device: Rolling walker (2 wheeled)       General Gait Details: Pt with slow, cautious ambulation with walker and KI.  She did need cuing to insure advancing the R LE past step-to.  Pt was heavily reliant on the walker, clearly not putting more than 50% PWBing on R.     Stairs            Wheelchair Mobility    Modified Rankin (Stroke Patients Only)       Balance Overall balance assessment: Modified Independent                                           Pertinent Vitals/Pain Pain Assessment: 0-10 Pain Score: 3  (only minimal pain increase with activity)    Home Living Family/patient expects to be discharged to:: Private residence Living Arrangements: Alone Available Help at Discharge: Family;Personal care attendant   Home Access: Level entry       Home Equipment: Walker - 4 wheels;Cane - single point Additional Comments: Pt reports she will be able to have 24/7 support at home    Prior Function Level of Independence: Independent         Comments: Pt running all her errands, staying active, independent with all tasks     Hand Dominance        Extremity/Trunk Assessment   Upper Extremity Assessment Upper Extremity Assessment: Overall WFL for tasks assessed    Lower Extremity Assessment Lower Extremity Assessment: Overall WFL for tasks assessed;RLE deficits/detail RLE Deficits / Details: Pt with decreased R LE strength as expected post surgery but was able to do 10 SLRs, showed >3/5 strength with most LE tasks and despite some pain with PROM flexion  achieved >65 degrees       Communication   Communication: No difficulties  Cognition Arousal/Alertness: Awake/alert Behavior During Therapy: WFL for tasks assessed/performed Overall Cognitive Status: Within Functional Limits for tasks assessed                                        General Comments      Exercises Total Joint Exercises Ankle Circles/Pumps: AROM;10 reps Quad Sets: Strengthening;10 reps Gluteal Sets: Strengthening;10 reps Heel Slides: 5 reps;AAROM Hip ABduction/ADduction: Strengthening;10 reps Straight Leg Raises: AROM;10 reps Knee Flexion: PROM;5 reps Goniometric ROM: 0-67   Assessment/Plan    PT Assessment  Patient needs continued PT services  PT Problem List Decreased strength;Decreased range of motion;Decreased activity tolerance;Decreased balance;Decreased mobility;Decreased knowledge of use of DME;Decreased safety awareness;Decreased knowledge of precautions;Pain       PT Treatment Interventions Gait training;DME instruction;Therapeutic activities;Therapeutic exercise;Balance training;Functional mobility training;Patient/family education    PT Goals (Current goals can be found in the Care Plan section)  Acute Rehab PT Goals Patient Stated Goal: Go home PT Goal Formulation: With patient Time For Goal Achievement: 11/14/16 Potential to Achieve Goals: Good    Frequency BID   Barriers to discharge        Co-evaluation               End of Session Equipment Utilized During Treatment: Gait belt;Right knee immobilizer Activity Tolerance: Patient tolerated treatment well Patient left: with chair alarm set;with call bell/phone within reach   PT Visit Diagnosis: Muscle weakness (generalized) (M62.81);Difficulty in walking, not elsewhere classified (R26.2)    Time: 0109-3235 PT Time Calculation (min) (ACUTE ONLY): 51 min   Charges:   PT Evaluation $PT Eval Low Complexity: 1 Procedure PT Treatments $Gait Training: 8-22 mins $Therapeutic Exercise: 8-22 mins   PT G Codes:        Malachi Pro, DPT 11/03/2016, 9:36 AM

## 2016-11-04 LAB — CBC
HEMATOCRIT: 32.5 % — AB (ref 35.0–47.0)
Hemoglobin: 10.9 g/dL — ABNORMAL LOW (ref 12.0–16.0)
MCH: 30 pg (ref 26.0–34.0)
MCHC: 33.6 g/dL (ref 32.0–36.0)
MCV: 89.4 fL (ref 80.0–100.0)
PLATELETS: 182 10*3/uL (ref 150–440)
RBC: 3.63 MIL/uL — ABNORMAL LOW (ref 3.80–5.20)
RDW: 14 % (ref 11.5–14.5)
WBC: 10 10*3/uL (ref 3.6–11.0)

## 2016-11-04 NOTE — Progress Notes (Signed)
Subjective: 2 Days Post-Op Procedure(s) (LRB): TOTAL KNEE ARTHROPLASTY (Right)    Patient reports pain as moderate. Not feeling well or as strong today.  Pain worse and didn't sleep well.CSM good distally  Objective:   VITALS:   Vitals:   11/04/16 0100 11/04/16 0730  BP: (!) 154/55 (!) 152/45  Pulse: 81 80  Resp: 18 18  Temp: 99.2 F (37.3 C) 98.9 F (37.2 C)    Neurologically intact ABD soft Neurovascular intact Sensation intact distally Intact pulses distally Dorsiflexion/Plantar flexion intact Incision: no drainage  LABS  Recent Labs  11/02/16 1658 11/04/16 1100  HGB 10.8* 10.9*  HCT 32.6* 32.5*  WBC 7.9 10.0  PLT 157 182     Recent Labs  11/02/16 1658 11/03/16 0439  NA  --  140  K  --  3.8  BUN  --  13  CREATININE 0.74 0.64  GLUCOSE  --  107*    No results for input(s): LABPT, INR in the last 72 hours.   Assessment/Plan: 2 Days Post-Op Procedure(s) (LRB): TOTAL KNEE ARTHROPLASTY (Right)   D/C tomorrow Start OPPT next week

## 2016-11-04 NOTE — Progress Notes (Signed)
Patient is A&O x4. Up with assist x 1 WW. Pain meds given with good relief. Tolerated diet. No N&V. LBM this shift. Urinating in [facility] TENs, Cryo cuff, TEDs and foot pumps in place. NSL. Bed alarm on for safety. Up in chair for short periods of time. Dressing to right knee intact.

## 2016-11-04 NOTE — Progress Notes (Signed)
Physical Therapy Treatment Patient Details Name: Pamela Shaffer MRN: [id number] DOB: 1926/06/25 Today's Date: 11/04/2016    History of Present Illness 83 y/o female s/p R TKA 4/18. PMHx includes GERD, HTN, arthritis, RA.    PT Comments    Despite feeling tired pt was willing to work with PT and generally did well.  She was able to walk with good confidence and was able to negotiate up/down steps w/o direct assist.  She reported increased confidence with ambulation this afternoon and had better cadence and speed with considerably less veering L.  Pt making gains with ROM, though remains quite pain sensitive with ROM > 60.   Follow Up Recommendations  Home health PT     Equipment Recommendations  Rolling walker with 5" wheels;3in1 (PT)    Recommendations for Other Services       Precautions / Restrictions Precautions Precautions: Fall Required Braces or Orthoses: Knee Immobilizer - Right Restrictions Weight Bearing Restrictions: Yes RLE Weight Bearing: Partial weight bearing    Mobility  Bed Mobility Overal bed mobility: Modified Independent                Transfers Overall transfer level: Modified independent Equipment used: Rolling walker (2 wheeled) Transfers: Sit to/from Stand Sit to Stand: Min guard         General transfer comment: Pt initially needing cues again for set up/positioning, ultimately able to get up/down w/o direct assist  Ambulation/Gait Ambulation/Gait assistance: Min guard Ambulation Distance (Feet): 130 Feet Assistive device: Rolling walker (2 wheeled)       General Gait Details: Pt with less veering to the L and less hesitancy with R stance phase.  Pt doing well with no LOBs and less reported fatigue this afternoon with similar distance as the AM.   Stairs Stairs: Yes   Stair Management: Two rails Number of Stairs: 4 General stair comments: Pt able to negotiate up/down steps with some hesitantion and heavy UE use of  rails  Wheelchair Mobility    Modified Rankin (Stroke Patients Only)       Balance Overall balance assessment: Modified Independent                                          Cognition Arousal/Alertness: Awake/alert Behavior During Therapy: WFL for tasks assessed/performed Overall Cognitive Status: Within Functional Limits for tasks assessed                                        Exercises Total Joint Exercises Ankle Circles/Pumps: AROM;10 reps Quad Sets: Strengthening;15 reps Gluteal Sets: Strengthening;15 reps Short Arc Quad: AROM;Strengthening;10 reps Heel Slides: 10 reps;AAROM Hip ABduction/ADduction: Strengthening;10 reps Straight Leg Raises: AROM;10 reps;AAROM Knee Flexion: PROM;5 reps    General Comments        Pertinent Vitals/Pain Pain Assessment: 0-10 Pain Score: 5  Pain Location: R knee at rest Pain Descriptors / Indicators: Aching Pain Intervention(s): Limited activity within patient's tolerance;Monitored during session;Premedicated before session;Ice applied    Home Living                      Prior Function            PT Goals (current goals can now be found in the care plan section) Acute Rehab PT Goals Patient  Stated Goal: Go home Progress towards PT goals: Progressing toward goals    Frequency    BID      PT Plan Current plan remains appropriate    Co-evaluation             End of Session Equipment Utilized During Treatment: Gait belt Activity Tolerance: Patient tolerated treatment well Patient left: with call bell/phone within reach;with bed alarm set   PT Visit Diagnosis: Muscle weakness (generalized) (M62.81);Difficulty in walking, not elsewhere classified (R26.2)     Time: 1610-9604 PT Time Calculation (min) (ACUTE ONLY): 29 min  Charges:  $Gait Training: 8-22 mins $Therapeutic Exercise: 8-22 mins                    G Codes:       Malachi Pro, DPT 11/04/2016, 4:46  PM

## 2016-11-04 NOTE — Progress Notes (Signed)
Physical Therapy Treatment Patient Details Name: Pamela Shaffer MRN: [id number] DOB: February 16, 1926 Today's Date: 11/04/2016    History of Present Illness 83 y/o female s/p R TKA 4/18. PMHx includes GERD, HTN, arthritis, RA.    PT Comments    Pt is making consistent gains and has achieved typical POD2 expectations but is tentative about her functional mobility and confidence with ability to do prolonged ambulation.  She is very eager to work with PT but pain, stiffness, fatigue and general feelings of weakness in LE are cause for her to be unsure about being able to be very active when she initially goes home.  Pt doing well enough to go home, PT suggesting HHPT on discharge.   Follow Up Recommendations  Home health PT     Equipment Recommendations  Rolling walker with 5" wheels;3in1 (PT)    Recommendations for Other Services       Precautions / Restrictions Precautions Precautions: Fall Restrictions Weight Bearing Restrictions: Yes RLE Weight Bearing: Partial weight bearing    Mobility  Bed Mobility Overal bed mobility: Modified Independent             General bed mobility comments: Pt with definite need of rails to get to sitting EOB, but did not need any direct physical assist  Transfers Overall transfer level: Modified independent Equipment used: Rolling walker (2 wheeled) Transfers: Sit to/from Stand Sit to Stand: Supervision         General transfer comment: Pt was able to position herself better and rise to standing w/o direct assist this session  Ambulation/Gait Ambulation/Gait assistance: Min guard Ambulation Distance (Feet): 110 Feet Assistive device: Rolling walker (2 wheeled)       General Gait Details: Pt again improved with confidence with using walker, WBing and generally was able to maintain consistent cadence t/o the effort.  She still has some issues veering to the L (secondary to uneven UE WBing related to PWBing LEs?) Pt did fatigue with >100 ft  of ambulation, but overall was safe.   Stairs            Wheelchair Mobility    Modified Rankin (Stroke Patients Only)       Balance Overall balance assessment: Modified Independent                                          Cognition Arousal/Alertness: Awake/alert Behavior During Therapy: WFL for tasks assessed/performed Overall Cognitive Status: Within Functional Limits for tasks assessed                                        Exercises Total Joint Exercises Ankle Circles/Pumps: AROM;10 reps Quad Sets: Strengthening;10 reps Gluteal Sets: Strengthening;10 reps Short Arc Quad: AROM;Strengthening;10 reps Heel Slides: 10 reps;AAROM Hip ABduction/ADduction: Strengthening;10 reps Straight Leg Raises: AROM;10 reps;AAROM Knee Flexion: PROM;5 reps Goniometric ROM: 1-69    General Comments        Pertinent Vitals/Pain Pain Assessment: 0-10 Pain Score: 5  (increased pain with ROM and ambulation)    Home Living                      Prior Function            PT Goals (current goals can now be found in the care plan  section) Progress towards PT goals: Progressing toward goals    Frequency    BID      PT Plan Current plan remains appropriate    Co-evaluation             End of Session Equipment Utilized During Treatment: Gait belt Activity Tolerance: Patient tolerated treatment well Patient left: with call bell/phone within reach;with chair alarm set   PT Visit Diagnosis: Muscle weakness (generalized) (M62.81);Difficulty in walking, not elsewhere classified (R26.2)     Time: 0981-1914 PT Time Calculation (min) (ACUTE ONLY): 28 min  Charges:  $Gait Training: 8-22 mins $Therapeutic Exercise: 8-22 mins                    G Codes:      Malachi Pro, DPT 11/04/2016, 11:11 AM

## 2016-11-04 NOTE — Progress Notes (Signed)
Occupational Therapy Treatment Patient Details Name: Pamela Shaffer MRN: [id number] DOB: [birth year] Today's Date: 11/04/2016    History of present illness 83 y/o female s/p R TKA 4/18. PMHx includes GERD, HTN, arthritis, RA.   OT comments  Pt seen while sitting up in recliner with daughter Remigio Eisenmenger present as well.  Reviewed and practiced use of reacher and sock aid for LB dressing skills sitting in chair with min assist and cues.  Pt's daughter Remigio Eisenmenger present as well and asked good questions.  Discussed set up and assist at home and pt was only going to have 24/7 assist from [facility] for first 2 nights and rec having at least for a week if not longer since she has a hx of falling and lives alone.   Follow Up Recommendations  Home health OT    Equipment Recommendations  3 in 1 bedside commode (sock aide, LH shoe horn)    Recommendations for Other Services      Precautions / Restrictions Precautions Precautions: Fall Required Braces or Orthoses: Knee Immobilizer - Right Restrictions Weight Bearing Restrictions: Yes RLE Weight Bearing: Partial weight bearing       Mobility Bed Mobility Overal bed mobility: Modified Independent             General bed mobility comments: Pt with definite need of rails to get to sitting EOB, but did not need any direct physical assist  Transfers Overall transfer level: Modified independent Equipment used: Rolling walker (2 wheeled) Transfers: Sit to/from Stand Sit to Stand: Supervision         General transfer comment: Pt was able to position herself better and rise to standing w/o direct assist this session    Balance Overall balance assessment: Modified Independent                                         ADL either performed or assessed with clinical judgement   ADL Overall ADL's : Needs assistance/impaired                                       General ADL Comments: reviewed and practiced use of  reacher and sock aid for LB dressing skills sitting in chair with min assist and cues.  Pt's daughter Remigio Eisenmenger present as well and asked good questions.  Discussed set up and assist at home and pt was only going to have 24/7 assist for first 2 nights and rec having at least for a week if not longer since she has a hx of falling and lives alone.       Vision       Perception     Praxis      Cognition Arousal/Alertness: Awake/alert Behavior During Therapy: WFL for tasks assessed/performed Overall Cognitive Status: Within Functional Limits for tasks assessed                                          Exercises Total Joint Exercises Ankle Circles/Pumps: AROM;10 reps Quad Sets: Strengthening;10 reps Gluteal Sets: Strengthening;10 reps Short Arc Quad: AROM;Strengthening;10 reps Heel Slides: 10 reps;AAROM Hip ABduction/ADduction: Strengthening;10 reps Straight Leg Raises: AROM;10 reps;AAROM Knee Flexion: PROM;5 reps Goniometric ROM: 1-69   Shoulder Instructions  General Comments      Pertinent Vitals/ Pain       Pain Assessment: 0-10 Pain Score: 3  Pain Location: R knee at rest Pain Descriptors / Indicators: Aching Pain Intervention(s): Limited activity within patient's tolerance;Monitored during session;Premedicated before session;Ice applied  Home Living                                          Prior Functioning/Environment              Frequency  Min 1X/week        Progress Toward Goals  OT Goals(current goals can now be found in the care plan section)  Progress towards OT goals: Progressing toward goals  Acute Rehab OT Goals Patient Stated Goal: Go home OT Goal Formulation: With patient/family Time For Goal Achievement: 11/17/16 Potential to Achieve Goals: Good  Plan Discharge plan remains appropriate    Co-evaluation                 End of Session    OT Visit Diagnosis: Other abnormalities of gait and  mobility (R26.89);Muscle weakness (generalized) (M62.81);Pain Pain - Right/Left: Right Pain - part of body: Knee   Activity Tolerance Patient tolerated treatment well   Patient Left in chair;with call bell/phone within reach;with chair alarm set;with family/visitor present   Nurse Communication          Time: 4782-9562 OT Time Calculation (min): 28 min  Charges: OT General Charges $OT Visit: 1 Procedure OT Treatments $Self Care/Home Management : 23-37 mins  Susanne Borders, OTR/L ascom [phone] 11/04/16, 2:34 PM

## 2016-11-05 MED ORDER — ASPIRIN EC 325 MG PO TBEC: 325.0000 mg | DELAYED_RELEASE_TABLET | Freq: Every day | ORAL | 0 refills | Status: DC

## 2016-11-05 MED ORDER — HYDROCODONE-ACETAMINOPHEN 5-325 MG PO TABS: 1.0000 | ORAL_TABLET | Freq: Four times a day (QID) | ORAL | 0 refills | Status: DC | PRN

## 2016-11-05 NOTE — Progress Notes (Signed)
Physical Therapy Treatment Patient Details Name: Pamela Shaffer MRN: [id number] DOB: 1926-06-28 Today's Date: 11/05/2016    History of Present Illness 83 y/o female s/p R TKA 4/18. PMHx includes GERD, HTN, arthritis, RA.    PT Comments    Participated in exercises as described below.  Pt with assist to don/doff KI.  She is able to transition out of bed with rail and increased time.  Ambulation increased today to 150' with walker and min guard.  Initially she kept RW too close to her increasing risk for falls but corrected with verbal cues.  Pt stated she is comfortable with discharge plan and anticipates discharge home today.   Follow Up Recommendations  Home health PT     Equipment Recommendations  Rolling walker with 5" wheels;3in1 (PT)    Recommendations for Other Services       Precautions / Restrictions Precautions Precautions: Fall Required Braces or Orthoses: Knee Immobilizer - Right Restrictions Weight Bearing Restrictions: Yes RLE Weight Bearing: Partial weight bearing    Mobility  Bed Mobility Overal bed mobility: Modified Independent             General bed mobility comments: Pt with definite need of rails to get to sitting EOB, but did not need any direct physical assist  Transfers Overall transfer level: Modified independent Equipment used: Rolling walker (2 wheeled) Transfers: Sit to/from Stand Sit to Stand: Min guard            Ambulation/Gait Ambulation/Gait assistance: Min guard Ambulation Distance (Feet): 150 Feet Assistive device: Rolling walker (2 wheeled)     Gait velocity interpretation: Below normal speed for age/gender General Gait Details: Min vc's upon initiating gait to keep RW further in front of her to pervent post fall.  Verbalized understanding.   Stairs         General stair comments: declined stair training today.  Stated she does not have stairs at home.  Wheelchair Mobility    Modified Rankin (Stroke Patients  Only)       Balance Overall balance assessment: Modified Independent                                          Cognition Arousal/Alertness: Awake/alert Behavior During Therapy: WFL for tasks assessed/performed Overall Cognitive Status: Within Functional Limits for tasks assessed                                        Exercises Total Joint Exercises Ankle Circles/Pumps: AROM;10 reps Quad Sets: Strengthening;15 reps Gluteal Sets: Strengthening;15 reps Short Arc Quad: AROM;Strengthening;10 reps Heel Slides: 10 reps;AAROM Hip ABduction/ADduction: Strengthening;10 reps Straight Leg Raises: AROM;10 reps;AAROM Knee Flexion: PROM;5 reps Goniometric ROM: 1-69  Pt with bulky dressing which limits flexion    General Comments        Pertinent Vitals/Pain Pain Assessment: 0-10 Pain Score: 2  Pain Location: R knee "not bad" Pain Descriptors / Indicators: Sore Pain Intervention(s): Limited activity within patient's tolerance    Home Living                      Prior Function            PT Goals (current goals can now be found in the care plan section) Progress towards PT goals: Progressing toward  goals    Frequency    BID      PT Plan Current plan remains appropriate    Co-evaluation             End of Session Equipment Utilized During Treatment: Gait belt Activity Tolerance: Patient tolerated treatment well Patient left: with call bell/phone within reach;in chair;with chair alarm set         Time: 0820-0853 PT Time Calculation (min) (ACUTE ONLY): 33 min  Charges:  $Gait Training: 8-22 mins $Therapeutic Exercise: 8-22 mins                    G Codes:       {Kanisha Duba, PTA 11/05/16, 9:24 AM

## 2016-11-05 NOTE — Progress Notes (Signed)
Subjective: 3 Days Post-Op Procedure(s) (LRB): TOTAL KNEE ARTHROPLASTY (Right)    Patient reports pain as mild.  Objective:   VITALS:   Vitals:   11/04/16 2342 11/05/16 1118  BP:  (!) 116/51  Pulse: 73 62  Resp:  18  Temp:  98 F (36.7 C)    Neurologically intact ABD soft Neurovascular intact Sensation intact distally Intact pulses distally Dorsiflexion/Plantar flexion intact Incision: no drainage  LABS  Recent Labs  11/02/16 1658 11/04/16 1100  HGB 10.8* 10.9*  HCT 32.6* 32.5*  WBC 7.9 10.0  PLT 157 182     Recent Labs  11/02/16 1658 11/03/16 0439  NA  --  140  K  --  3.8  BUN  --  13  CREATININE 0.74 0.64  GLUCOSE  --  107*    No results for input(s): LABPT, INR in the last 72 hours.   Assessment/Plan: 3 Days Post-Op Procedure(s) (LRB): TOTAL KNEE ARTHROPLASTY (Right)   Up with therapy  D/C today RTC next week Start OPPT Monday

## 2016-11-05 NOTE — Progress Notes (Signed)
Occupational Therapy Treatment Patient Details Name: Pamela Shaffer MRN: [id number] DOB: 1925-10-06 Today's Date: 11/05/2016    History of present illness 83 y/o female s/p R TKA 4/18. PMHx includes GERD, HTN, arthritis, RA.   OT comments  Patient was sitting up in recliner when OT arrived. She was agreeable to work with OT. Patient recalled training on LB adaptive equipment from yesterday. Able to perform donn/doff of socks with setup and verbal cues for managing handles of sock aid during task. Patient able to perform sit/stand with MIN A. Discussed safety needs at home, and fall risks. Patient alert and participated well with verbal understanding noted.   Follow Up Recommendations  Home health OT    Equipment Recommendations       Recommendations for Other Services      Precautions / Restrictions Precautions Precautions: Fall Required Braces or Orthoses: Knee Immobilizer - Right Restrictions Weight Bearing Restrictions: Yes RLE Weight Bearing: Partial weight bearing       Mobility Bed Mobility Overal bed mobility: Modified Independent             General bed mobility comments: Pt with definite need of rails to get to sitting EOB, but did not need any direct physical assist  Transfers Overall transfer level: Modified independent Equipment used: Rolling walker (2 wheeled) Transfers: Sit to/from Stand Sit to Stand: Min guard              Balance Overall balance assessment: Modified Independent                                         ADL either performed or assessed with clinical judgement   ADL Overall ADL's : Needs assistance/impaired                     Lower Body Dressing: With adaptive equipment;Set up Lower Body Dressing Details (indicate cue type and reason): pt educated in use of AE for LB ADL, return demo safe technique to don/doff socks                General ADL Comments: Patient recalled use of reacher and sock aid  from previous session. Was able to perform donn/doff of sock with setup and verbal cues for adjusting sock aid handles.     Vision   Eye Alignment: Within Functional Limits   Perception     Praxis      Cognition Arousal/Alertness: Awake/alert Behavior During Therapy: WFL for tasks assessed/performed Overall Cognitive Status: Within Functional Limits for tasks assessed                                          Exercises Total Joint Exercises Ankle Circles/Pumps: AROM;10 reps Quad Sets: Strengthening;15 reps Gluteal Sets: Strengthening;15 reps Short Arc Quad: AROM;Strengthening;10 reps Heel Slides: 10 reps;AAROM Hip ABduction/ADduction: Strengthening;10 reps Straight Leg Raises: AROM;10 reps;AAROM Knee Flexion: PROM;5 reps Goniometric ROM: 1-69  Pt with bulky dressing which limits flexion   Shoulder Instructions       General Comments      Pertinent Vitals/ Pain       Pain Assessment: 0-10 Pain Score: 3  Pain Location: R knee "not bad" Pain Descriptors / Indicators: Sore Pain Intervention(s): Limited activity within patient's tolerance  Home Living  Prior Functioning/Environment              Frequency  Min 1X/week        Progress Toward Goals  OT Goals(current goals can now be found in the care plan section)  Progress towards OT goals: Progressing toward goals  Acute Rehab OT Goals Patient Stated Goal: Go home OT Goal Formulation: With patient/family Time For Goal Achievement: 11/17/16 Potential to Achieve Goals: Good  Plan Discharge plan remains appropriate    Co-evaluation                 End of Session    OT Visit Diagnosis: Other abnormalities of gait and mobility (R26.89);Muscle weakness (generalized) (M62.81);Pain Pain - Right/Left: Right Pain - part of body: Knee   Activity Tolerance Patient tolerated treatment well   Patient Left in chair;with call  bell/phone within reach;with chair alarm set;with family/visitor present   Nurse Communication          Time: 1610-9604 OT Time Calculation (min): 24 min  Charges: OT General Charges $OT Visit: 1 Procedure OT Treatments $Self Care/Home Management : 23-37 mins  Kirstie Peri, OTR/L   Georganne Siple L 11/05/2016, 12:47 PM

## 2016-11-05 NOTE — Progress Notes (Signed)
Physical Therapy Treatment Patient Details Name: Pamela Shaffer MRN: [id number] DOB: 12-Apr-1926 Today's Date: 11/05/2016    History of Present Illness 83 y/o female s/p R TKA 4/18. PMHx includes GERD, HTN, arthritis, RA.    PT Comments    Pt ambulated in hallway with shoes donned demonstrating reciprocal gait pattern and safe technique. Cued for upright posture. Reviewed HEP and answered questions regarding don/doffing KI. Pt safe to dc home this date.   Follow Up Recommendations  Home health PT     Equipment Recommendations  Rolling walker with 5" wheels;3in1 (PT)    Recommendations for Other Services       Precautions / Restrictions Precautions Precautions: Fall;Knee Precaution Booklet Issued: Yes (comment) Required Braces or Orthoses: Knee Immobilizer - Right Restrictions Weight Bearing Restrictions: Yes RLE Weight Bearing: Partial weight bearing    Mobility  Bed Mobility               General bed mobility comments: not performed as received pt in recliner  Transfers Overall transfer level: Modified independent Equipment used: Rolling walker (2 wheeled) Transfers: Sit to/from Stand Sit to Stand: Modified independent (Device/Increase time)         General transfer comment: safe technique performed with RW  Ambulation/Gait Ambulation/Gait assistance: Supervision Ambulation Distance (Feet): 100 Feet Assistive device: Rolling walker (2 wheeled) Gait Pattern/deviations: Step-through pattern     General Gait Details: ambulated with cue for looking forward instead of at ground. Pt able to maintain WB status with ease. Safe technique. Shoes donned prior to mobility   Information systems manager Rankin (Stroke Patients Only)       Balance                                            Cognition Arousal/Alertness: Awake/alert Behavior During Therapy: WFL for tasks assessed/performed Overall Cognitive Status:  Within Functional Limits for tasks assessed                                        Exercises      General Comments        Pertinent Vitals/Pain Pain Assessment: No/denies pain Pain Score: 3  Pain Location: R knee "not bad" Pain Intervention(s): Limited activity within patient's tolerance    Home Living                      Prior Function            PT Goals (current goals can now be found in the care plan section) Acute Rehab PT Goals Patient Stated Goal: Go home PT Goal Formulation: With patient Time For Goal Achievement: 11/14/16 Potential to Achieve Goals: Good Progress towards PT goals: Progressing toward goals    Frequency    BID      PT Plan Current plan remains appropriate    Co-evaluation             End of Session Equipment Utilized During Treatment: Gait belt Activity Tolerance: Patient tolerated treatment well Patient left: in chair;with chair alarm set Nurse Communication: Mobility status PT Visit Diagnosis: Muscle weakness (generalized) (M62.81);Difficulty in walking, not elsewhere classified (R26.2)     Time: 4098-1191 PT  Time Calculation (min) (ACUTE ONLY): 23 min  Charges:  $Gait Training: 23-37 mins                    G Codes:       Elizabeth Palau, PT, DPT [phone]    Shaolin Armas 11/05/2016, 1:51 PM

## 2016-11-05 NOTE — Care Management Note (Signed)
Case Management Note  Patient Details  Name: Danyal Adorno MRN: [id number] Date of Birth: 10-11-1925  Subjective/Objective:  Dr Hyacinth Meeker reports that he is arranging outpatient PT through his office for Mrs Tenorio. A request for a home BSC and a RW was called to [employer], [facility], at [facility].                  Action/Plan:   Expected Discharge Date:  11/04/16               Expected Discharge Plan:     In-House Referral:     Discharge planning Services     Post Acute Care Choice:    Choice offered to:     DME Arranged:    DME Agency:     HH Arranged:    HH Agency:     Status of Service:     If discussed at [employer], dates discussed:    Additional Comments:  Ariyonna Twichell A, RN 11/05/2016, 12:40 PM

## 2016-11-05 NOTE — Discharge Summary (Signed)
Physician Discharge Summary  Patient ID: Pamela Shaffer MRN: [id number] DOB/AGE: 81-Nov-1927 81 y.o.  Admit date: 11/02/2016 Discharge date: 11/05/2016  Admission Diagnoses:  Arthritis right knee  Discharge Diagnoses:  Active Problems:   Total knee replacement status   Discharged Condition: good  Hospital Course: Satisfactory right total knee on 11/02/16 with good post op x-rays.  Patient did well with PT and with minimal pain. Ready for D/C today.   Consults: None  Significant Diagnostic Studies: radiology:  Post op x-ray satisfactory  Treatments: antibiotics: vancomycin  Discharge Exam: Blood pressure (!) 116/51, pulse 62, temperature 98 F (36.7 C), temperature source Oral, resp. rate 18, height  (1.575 m), weight 63.5 kg (140 lb), SpO2 92 %.   OOB ain chair.  Dressing dry and changed today. CSM good. Walking with PT.  Disposition:   Discharge Instructions    Call MD for:  persistant nausea and vomiting    Complete by:  As directed    Call MD for:  redness, tenderness, or signs of infection (pain, swelling, redness, odor or green/yellow discharge around incision site)    Complete by:  As directed    Call MD for:  severe uncontrolled pain    Complete by:  As directed    Call MD for:  temperature >100.4    Complete by:  As directed    Diet - low sodium heart healthy    Complete by:  As directed    Discharge instructions    Complete by:  As directed    50% weight right leg Start PT Monday Move knee 3-4x/ day on side of bed RTC as appointed next week   For home use only DME Bedside commode    Complete by:  As directed    Patient needs a bedside commode to treat with the following condition:  Total knee replacement status   Increase activity slowly    Complete by:  As directed      Allergies as of 11/05/2016   No Known Allergies     Medication List    TAKE these medications   ADVIL PM PO Take 0.5 tablets by mouth at bedtime as needed (sleep).   amLODipine  2.5 MG tablet Commonly known as:  NORVASC Take 2.5 mg by mouth at bedtime.   amoxicillin 500 MG capsule Commonly known as:  AMOXIL Take 2,000 mg by mouth See admin instructions. Take 4 capsules 1 hours prior to dental appointments.   aspirin EC 325 MG tablet Take 1 tablet (325 mg total) by mouth daily.   CALTRATE 600+D 600-400 MG-UNIT tablet Generic drug:  Calcium Carbonate-Vitamin D Take 2 tablets by mouth daily at 12 noon.   DENTA 5000 PLUS 1.1 % Crea dental cream Generic drug:  sodium fluoride Take 1 application by mouth at bedtime.   diclofenac 75 MG EC tablet Commonly known as:  VOLTAREN Take 75 mg by mouth 2 (two) times daily.   gabapentin 100 MG capsule Commonly known as:  NEURONTIN Take 100 mg by mouth 3 (three) times daily.   HYDROcodone-acetaminophen 5-325 MG tablet Commonly known as:  NORCO Take 1 tablet by mouth every 6 (six) hours as needed.   lovastatin 20 MG tablet Commonly known as:  MEVACOR Take 20 mg by mouth daily with supper.   omeprazole 20 MG capsule Commonly known as:  PRILOSEC Take 20 mg by mouth daily with supper.   PHAZYME ULTRA STRENGTH 180 MG Caps Generic drug:  Simethicone Take 1 capsule by mouth daily  as needed (gas).   traMADol 50 MG tablet Commonly known as:  ULTRAM Take 50 mg by mouth every 6 (six) hours as needed (pain).            Durable Medical Equipment        Start     Ordered   11/05/16 1246  For home use only DME Walker rolling  Once    Question:  Patient needs a walker to treat with the following condition  Answer:  Total knee replacement status   11/05/16 1247   11/05/16 0000  For home use only DME Bedside commode    Question:  Patient needs a bedside commode to treat with the following condition  Answer:  Total knee replacement status   11/05/16 1247   11/02/16 1646  DME Walker rolling  Once    Question:  Patient needs a walker to treat with the following condition  Answer:  Total knee replacement status    11/02/16 1646       Signed: Deeann Saint E 11/05/2016, 12:50 PM

## 2018-04-04 ENCOUNTER — Emergency Department: Payer: Medicare Other

## 2018-04-04 ENCOUNTER — Emergency Department
Admission: EM | Admit: 2018-04-04 | Discharge: 2018-04-04 | Disposition: A | Discharge status: Home / Self Care | Payer: Medicare Other | Attending: Emergency Medicine | Admitting: Emergency Medicine

## 2018-04-04 DIAGNOSIS — Z79899 Other long term (current) drug therapy: Secondary | ICD-10-CM | POA: Insufficient documentation

## 2018-04-04 DIAGNOSIS — S2232XA Fracture of one rib, left side, initial encounter for closed fracture: Secondary | ICD-10-CM | POA: Insufficient documentation

## 2018-04-04 DIAGNOSIS — Y999 Unspecified external cause status: Secondary | ICD-10-CM | POA: Insufficient documentation

## 2018-04-04 DIAGNOSIS — S0083XA Contusion of other part of head, initial encounter: Secondary | ICD-10-CM | POA: Insufficient documentation

## 2018-04-04 DIAGNOSIS — S0990XA Unspecified injury of head, initial encounter: Secondary | ICD-10-CM | POA: Diagnosis present

## 2018-04-04 DIAGNOSIS — Z87891 Personal history of nicotine dependence: Secondary | ICD-10-CM | POA: Insufficient documentation

## 2018-04-04 DIAGNOSIS — Y92009 Unspecified place in unspecified non-institutional (private) residence as the place of occurrence of the external cause: Secondary | ICD-10-CM | POA: Diagnosis not present

## 2018-04-04 DIAGNOSIS — Z96651 Presence of right artificial knee joint: Secondary | ICD-10-CM | POA: Diagnosis not present

## 2018-04-04 DIAGNOSIS — W19XXXA Unspecified fall, initial encounter: Secondary | ICD-10-CM | POA: Insufficient documentation

## 2018-04-04 DIAGNOSIS — Z7982 Long term (current) use of aspirin: Secondary | ICD-10-CM | POA: Insufficient documentation

## 2018-04-04 DIAGNOSIS — Z96611 Presence of right artificial shoulder joint: Secondary | ICD-10-CM | POA: Diagnosis not present

## 2018-04-04 DIAGNOSIS — R55 Syncope and collapse: Secondary | ICD-10-CM

## 2018-04-04 DIAGNOSIS — S62347A Nondisplaced fracture of base of fifth metacarpal bone. left hand, initial encounter for closed fracture: Secondary | ICD-10-CM

## 2018-04-04 DIAGNOSIS — I10 Essential (primary) hypertension: Secondary | ICD-10-CM | POA: Diagnosis not present

## 2018-04-04 DIAGNOSIS — Y939 Activity, unspecified: Secondary | ICD-10-CM | POA: Diagnosis not present

## 2018-04-04 LAB — COMPREHENSIVE METABOLIC PANEL
ALK PHOS: 44 U/L (ref 38–126)
ALT: 19 U/L (ref 0–44)
ANION GAP: 10 (ref 5–15)
AST: 27 U/L (ref 15–41)
Albumin: 4.3 g/dL (ref 3.5–5.0)
BUN: 18 mg/dL (ref 8–23)
CALCIUM: 9.3 mg/dL (ref 8.9–10.3)
CHLORIDE: 110 mmol/L (ref 98–111)
CO2: 22 mmol/L (ref 22–32)
Creatinine, Ser: 0.58 mg/dL (ref 0.44–1.00)
Glucose, Bld: 115 mg/dL — ABNORMAL HIGH (ref 70–99)
Potassium: 3.8 mmol/L (ref 3.5–5.1)
SODIUM: 142 mmol/L (ref 135–145)
Total Bilirubin: 1 mg/dL (ref 0.3–1.2)
Total Protein: 6.9 g/dL (ref 6.5–8.1)

## 2018-04-04 LAB — URINALYSIS, COMPLETE (UACMP) WITH MICROSCOPIC
BILIRUBIN URINE: NEGATIVE
GLUCOSE, UA: NEGATIVE mg/dL
Ketones, ur: NEGATIVE mg/dL
Leukocytes, UA: NEGATIVE
NITRITE: NEGATIVE
PH: 6 (ref 5.0–8.0)
Protein, ur: NEGATIVE mg/dL
Specific Gravity, Urine: 1.009 (ref 1.005–1.030)
Squamous Epithelial / LPF: NONE SEEN (ref 0–5)

## 2018-04-04 LAB — CBC WITH DIFFERENTIAL/PLATELET
BASOS PCT: 0 %
Basophils Absolute: 0 10*3/uL (ref 0–0.1)
EOS ABS: 0.1 10*3/uL (ref 0–0.7)
Eosinophils Relative: 1 %
HCT: 39.6 % (ref 35.0–47.0)
Hemoglobin: 13.3 g/dL (ref 12.0–16.0)
LYMPHS ABS: 1.2 10*3/uL (ref 1.0–3.6)
Lymphocytes Relative: 10 %
MCH: 31.1 pg (ref 26.0–34.0)
MCHC: 33.6 g/dL (ref 32.0–36.0)
MCV: 92.6 fL (ref 80.0–100.0)
MONO ABS: 0.7 10*3/uL (ref 0.2–0.9)
MONOS PCT: 6 %
Neutro Abs: 10.4 10*3/uL — ABNORMAL HIGH (ref 1.4–6.5)
Neutrophils Relative %: 83 %
Platelets: 164 10*3/uL (ref 150–440)
RBC: 4.27 MIL/uL (ref 3.80–5.20)
RDW: 13.3 % (ref 11.5–14.5)
WBC: 12.4 10*3/uL — ABNORMAL HIGH (ref 3.6–11.0)

## 2018-04-04 LAB — TROPONIN I

## 2018-04-04 MED ORDER — SODIUM CHLORIDE 0.9 % IV BOLUS
500.0000 mL | Freq: Once | INTRAVENOUS | Status: AC
  Administered 2018-04-04: 500 mL via INTRAVENOUS

## 2018-04-04 NOTE — ED Notes (Signed)
Pt denies neck pain. C collar removed. Ok per MD

## 2018-04-04 NOTE — ED Notes (Signed)
Pt returned from XR. ABCs intact. NAD. 

## 2018-04-04 NOTE — ED Provider Notes (Signed)
[facility] Emergency Department Provider Note ____________________________________________   First MD Initiated Contact with Patient 04/04/18 1545     (approximate)  I have reviewed the triage vital signs and the nursing notes.   HISTORY  Chief Complaint Loss of Consciousness    HPI Pamela Shaffer is a 84 y.o. female with PMH as noted below who presents with syncope and head injury, acute onset approximately 3 hours ago, and occurring while the patient was in her home.  The patient states that she just "blacked out" and does not remember much after this except that she eventually pulled herself into a room where she could call 911.  She reports hitting her head and chin, and she also reports left-sided rib pain and left wrist pain.  She states that she was in her usual state of health and was feeling fine this morning.  She does report some prior similar syncopal episodes.  Past Medical History:  Diagnosis Date  . Arthritis    osteoporosis  . GERD (gastroesophageal reflux disease)   . Hypercholesteremia   . Hypertension     Patient Active Problem List   Diagnosis Date Noted  . Total knee replacement status 11/02/2016    Past Surgical History:  Procedure Laterality Date  . APPENDECTOMY    . CHOLECYSTECTOMY    . EYE SURGERY Bilateral    Cataract Extraction with IOL  . TOTAL KNEE ARTHROPLASTY Right 11/02/2016   Procedure: TOTAL KNEE ARTHROPLASTY;  Surgeon: Deeann Saint, MD;  Location: [facility] ORS;  Service: Orthopedics;  Laterality: Right;  . TOTAL SHOULDER ARTHROPLASTY Right     Prior to Admission medications   Medication Sig Start Date End Date Taking? Authorizing Provider  amLODipine (NORVASC) 2.5 MG tablet Take 2.5 mg by mouth at bedtime.  07/25/16   [provider]  amoxicillin (AMOXIL) 500 MG capsule Take 2,000 mg by mouth See admin instructions. Take 4 capsules 1 hours prior to dental appointments. 09/28/16   [provider]    aspirin EC 325 MG tablet Take 1 tablet (325 mg total) by mouth daily. 11/05/16   Deeann Saint, MD  Calcium Carbonate-Vitamin D (CALTRATE 600+D) 600-400 MG-UNIT tablet Take 2 tablets by mouth daily at 12 noon.    [provider]  DENTA 5000 PLUS 1.1 % CREA dental cream Take 1 application by mouth at bedtime. 09/28/16   [provider]  diclofenac (VOLTAREN) 75 MG EC tablet Take 75 mg by mouth 2 (two) times daily. 09/21/16   [provider]  gabapentin (NEURONTIN) 100 MG capsule Take 100 mg by mouth 3 (three) times daily.    [provider]  HYDROcodone-acetaminophen (NORCO) 5-325 MG tablet Take 1 tablet by mouth every 6 (six) hours as needed. 11/05/16   Deeann Saint, MD  Ibuprofen-Diphenhydramine Cit (ADVIL PM PO) Take 0.5 tablets by mouth at bedtime as needed (sleep).    [provider]  lovastatin (MEVACOR) 20 MG tablet Take 20 mg by mouth daily with supper. 07/25/16   [provider]  omeprazole (PRILOSEC) 20 MG capsule Take 20 mg by mouth daily with supper. 08/26/16   [provider]  Simethicone (PHAZYME ULTRA STRENGTH) 180 MG CAPS Take 1 capsule by mouth daily as needed (gas).    [provider]  traMADol (ULTRAM) 50 MG tablet Take 50 mg by mouth every 6 (six) hours as needed (pain).    [provider]    Allergies Patient has no known allergies.  No family history on  file.  Social History Social History   Tobacco Use  . Smoking status: Former Smoker    Packs/day: 0.50    Types: Cigarettes  . Smokeless tobacco: Never Used  Substance Use Topics  . Alcohol use: No  . Drug use: No    Review of Systems  Constitutional: No fever. Eyes: No redness. ENT: No neck pain. Cardiovascular: Denies chest pain. Respiratory: Denies shortness of breath. Gastrointestinal: No vomiting.  Genitourinary: Negative for flank pain.  Musculoskeletal: Negative for back pain.  Positive for left wrist and left lower rib  pain Skin: Negative for rash. Neurological: Negative for headache.   ____________________________________________   PHYSICAL EXAM:  VITAL SIGNS: ED Triage Vitals  Enc Vitals Group     BP --      Pulse Rate 04/04/18 1536 97     Resp 04/04/18 1536 15     Temp 04/04/18 1536 98.3 F (36.8 C)     Temp Source 04/04/18 1536 Oral     SpO2 04/04/18 1536 97 %     Weight 04/04/18 1539 140 lb (63.5 kg)     Height 04/04/18 1539 4\' 8"  (1.422 m)     Head Circumference --      Peak Flow --      Pain Score 04/04/18 1536 7     Pain Loc --      Pain Edu? --      Excl. in GC? --     Constitutional: Alert and oriented.  Relatively comfortable appearing and in no acute distress. Eyes: Conjunctivae are normal.  EOMI.  PERRLA. Head: Ecchymosis to chin and above the left eye. Nose: No congestion/rhinnorhea. Mouth/Throat: Mucous membranes are somewhat dry.   Neck: Normal range of motion.  No midline spinal tenderness. Cardiovascular: Normal rate, regular rhythm. Grossly normal heart sounds.  Good peripheral circulation. Respiratory: Normal respiratory effort.  No retractions. Lungs CTAB.  Left anterior lower rib area mild tenderness. Gastrointestinal: Soft and nontender. No distention.  Genitourinary: No CVA tenderness. Musculoskeletal: No lower extremity edema.  Extremities warm and well perfused.  Neurologic:  Normal speech and language.  Motor and sensory intact in all extremities.  Normal coordination.  No gross focal neurologic deficits are appreciated.  Skin:  Skin is warm and dry. No rash noted. Psychiatric: Mood and affect are normal. Speech and behavior are normal.  ____________________________________________   LABS (all labs ordered are listed, but only abnormal results are displayed)  Labs Reviewed  URINALYSIS, COMPLETE (UACMP) WITH MICROSCOPIC - Abnormal; Notable for the following components:      Result Value   Color, Urine STRAW (*)    APPearance CLEAR (*)    Hgb urine  dipstick SMALL (*)    Bacteria, UA RARE (*)    All other components within normal limits  CBC WITH DIFFERENTIAL/PLATELET - Abnormal; Notable for the following components:   WBC 12.4 (*)    Neutro Abs 10.4 (*)    All other components within normal limits  COMPREHENSIVE METABOLIC PANEL - Abnormal; Notable for the following components:   Glucose, Bld 115 (*)    All other components within normal limits  TROPONIN I   ____________________________________________  EKG  ED ECG REPORT I, Dionne BucySebastian Bud Kaeser, the attending physician, personally viewed and interpreted this ECG.  Date: 04/04/2018 EKG Time: 1537 Rate: 93 Rhythm: normal sinus rhythm QRS Axis: normal Intervals: Prolonged PR, RBBB, prolonged QT ST/T Wave abnormalities: normal Narrative Interpretation: no evidence of acute ischemia  ____________________________________________  RADIOLOGY  CT head: No  ICH CT maxillofacial: No acute fracture CT cervical spine: No acute fracture XR L rib: 6th rib fracture, nondisplaced XR L wrist: Nondisplaced fifth metacarpal fracture  ____________________________________________   PROCEDURES  Procedure(s) performed: No  Procedures  Critical Care performed: No ____________________________________________   INITIAL IMPRESSION / ASSESSMENT AND PLAN / ED COURSE  Pertinent labs & imaging results that were available during my care of the patient were reviewed by me and considered in my medical decision making (see chart for details).  84 year old female with PMH as noted above presents with an apparent syncopal episode with no clear prodrome, causing fall with head injury.  The patient states that she was in her usual state of health this morning until this episode, and feels relatively well now.  I reviewed the past medical records in Epic; the patient was admitted for syncope in 2013 but has had no recent ED visits or medical admissions.  On exam, the patient is alert and  relatively well-appearing.  Her vital signs are normal.  The remainder of the exam is as described above.  We will obtain imaging to rule out injury in the affected areas including the head, face, neck, ribs and wrist, as well as medical work-up including labs and UA to evaluate for precipitating causes of this episode.  ----------------------------------------- 8:22 PM on 04/04/2018 -----------------------------------------  The patient's lab work-up has been unremarkable.  Her UA is negative.  CT head shows no intracranial injury, and there is no facial or cervical spinal fracture.  She does have a 5th metacarpal fracture and at least one nondisplaced rib fracture with no other significant findings on chest x-ray.  She is feeling well and has not required any analgesia in the ED.  I discussed the results of the work-up with the patient and her daughter.  Although she did have a syncope without a clear prodrome, she has had this happen before with a negative work-up as well.  There is no evidence of arrhythmia today.  I offered admission for observation, however the patient expressed a preference to go home if possible.  I do feel that this is safe given the negative work-up and the patient's stable clinical status during her ED stay.  She feels completely safe to go home at this time.  A splint has been placed on the left hand.  The patient will take Tylenol at home for pain and does not want anything stronger.  She already has an incentive spirometer.  Return precautions given, the patient and her daughter expressed understanding. ____________________________________________   FINAL CLINICAL IMPRESSION(S) / ED DIAGNOSES  Final diagnoses:  Syncope, unspecified syncope type  Closed nondisplaced fracture of base of fifth metacarpal bone of left hand, initial encounter  Closed fracture of one rib of left side, initial encounter  Contusion of face, initial encounter      NEW MEDICATIONS  STARTED DURING THIS VISIT:  New Prescriptions   No medications on file     Note:  This document was prepared using Dragon voice recognition software and may include unintentional dictation errors.    Dionne Bucy, MD 04/04/18 2024

## 2018-04-04 NOTE — ED Notes (Signed)
Pt to XR. ABCs intact. NAD 

## 2018-04-04 NOTE — ED Notes (Signed)
Placed PT on bed pan. Cleaned PT up and gave her a call bell.

## 2018-04-04 NOTE — Discharge Instructions (Addendum)
Follow-up with the orthopedist in approximately 1 week, and with your primary care doctor soon as possible.  Make sure to take deep breaths and use her incentive spirometer at home.  Return to the ER for new, worsening, persistent pain, persistent or recurring lightheadedness or passing out, severe headache, fever, vomiting, weakness, shortness of breath, or any other new or worsening symptoms that concern he.

## 2018-04-04 NOTE — ED Notes (Signed)
Answered pts call bell and helped pt on and off of bed pan.

## 2018-04-04 NOTE — ED Notes (Signed)
L wrist splint placed by Zach EDT. Pts left hand remains neurovascularly intact.

## 2018-04-04 NOTE — ED Notes (Signed)
Zach EDT aware of need for ulnar gutter splint

## 2018-04-04 NOTE — ED Triage Notes (Signed)
Pt BIB [facility] from home s/p syncopal episode. Pt unconscious for ? 3 hours, pt last remembers watching TV at approx 1230p. Pt has notable bruising to chin and L eye. Pt co L rib cage pain. No active bleeding. Pt not on blood thinners. Pt denies N/V/D, chest pain SOB. Pt has hx of syncopal episodes but states they have never found the root cause. Upon arrival pt alert & oriented x4. ABCs intact. NAD.

## 2019-12-02 ENCOUNTER — Other Ambulatory Visit: Payer: Self-pay | Admitting: Internal Medicine

## 2019-12-02 DIAGNOSIS — R102 Pelvic and perineal pain: Secondary | ICD-10-CM

## 2019-12-13 ENCOUNTER — Other Ambulatory Visit: Payer: Self-pay

## 2019-12-13 ENCOUNTER — Ambulatory Visit
Admission: RE | Admit: 2019-12-13 | Discharge: 2019-12-13 | Disposition: A | Discharge status: Home / Self Care | Payer: Medicare Other | Source: Ambulatory Visit | Attending: Internal Medicine | Admitting: Internal Medicine

## 2019-12-13 DIAGNOSIS — R102 Pelvic and perineal pain: Secondary | ICD-10-CM | POA: Diagnosis not present

## 2019-12-13 MED ORDER — IOHEXOL 300 MG/ML  SOLN
75.0000 mL | Freq: Once | INTRAMUSCULAR | Status: AC | PRN
  Administered 2019-12-13: 75 mL via INTRAVENOUS

## 2020-06-03 ENCOUNTER — Encounter: Payer: Self-pay | Admitting: Emergency Medicine

## 2020-06-03 ENCOUNTER — Emergency Department: Payer: Medicare Other

## 2020-06-03 ENCOUNTER — Emergency Department
Admission: EM | Admit: 2020-06-03 | Discharge: 2020-06-04 | Disposition: A | Discharge status: Home / Self Care | Payer: Medicare Other | Attending: Emergency Medicine | Admitting: Emergency Medicine

## 2020-06-03 ENCOUNTER — Other Ambulatory Visit: Payer: Self-pay

## 2020-06-03 DIAGNOSIS — Z96611 Presence of right artificial shoulder joint: Secondary | ICD-10-CM | POA: Insufficient documentation

## 2020-06-03 DIAGNOSIS — Z87891 Personal history of nicotine dependence: Secondary | ICD-10-CM | POA: Diagnosis not present

## 2020-06-03 DIAGNOSIS — I10 Essential (primary) hypertension: Secondary | ICD-10-CM | POA: Diagnosis not present

## 2020-06-03 DIAGNOSIS — R1031 Right lower quadrant pain: Secondary | ICD-10-CM | POA: Insufficient documentation

## 2020-06-03 DIAGNOSIS — Z79899 Other long term (current) drug therapy: Secondary | ICD-10-CM | POA: Diagnosis not present

## 2020-06-03 DIAGNOSIS — K219 Gastro-esophageal reflux disease without esophagitis: Secondary | ICD-10-CM | POA: Insufficient documentation

## 2020-06-03 DIAGNOSIS — Z7982 Long term (current) use of aspirin: Secondary | ICD-10-CM | POA: Diagnosis not present

## 2020-06-03 DIAGNOSIS — Z96651 Presence of right artificial knee joint: Secondary | ICD-10-CM | POA: Insufficient documentation

## 2020-06-03 LAB — COMPREHENSIVE METABOLIC PANEL
ALT: 14 U/L (ref 0–44)
AST: 21 U/L (ref 15–41)
Albumin: 4.5 g/dL (ref 3.5–5.0)
Alkaline Phosphatase: 61 U/L (ref 38–126)
Anion gap: 11 (ref 5–15)
BUN: 23 mg/dL (ref 8–23)
CO2: 26 mmol/L (ref 22–32)
Calcium: 9.8 mg/dL (ref 8.9–10.3)
Chloride: 100 mmol/L (ref 98–111)
Creatinine, Ser: 0.82 mg/dL (ref 0.44–1.00)
GFR, Estimated: >60 mL/min (ref >60)
Glucose, Bld: 125 mg/dL — ABNORMAL HIGH (ref 70–99)
Potassium: 3.9 mmol/L (ref 3.5–5.1)
Sodium: 137 mmol/L (ref 135–145)
Total Bilirubin: 1.1 mg/dL (ref 0.3–1.2)
Total Protein: 7 g/dL (ref 6.5–8.1)

## 2020-06-03 LAB — CBC
HCT: 38.6 % (ref 36.0–46.0)
Hemoglobin: 12.6 g/dL (ref 12.0–15.0)
MCH: 30.2 pg (ref 26.0–34.0)
MCHC: 32.6 g/dL (ref 30.0–36.0)
MCV: 92.6 fL (ref 80.0–100.0)
Platelets: 197 10*3/uL (ref 150–400)
RBC: 4.17 MIL/uL (ref 3.87–5.11)
RDW: 13.1 % (ref 11.5–15.5)
WBC: 7 10*3/uL (ref 4.0–10.5)
nRBC: 0 % (ref 0.0–0.2)

## 2020-06-03 LAB — LIPASE, BLOOD: Lipase: 20 U/L (ref 11–51)

## 2020-06-03 LAB — URINALYSIS, COMPLETE (UACMP) WITH MICROSCOPIC
Bilirubin Urine: NEGATIVE
Glucose, UA: NEGATIVE mg/dL
Hgb urine dipstick: NEGATIVE
Ketones, ur: 20 mg/dL — AB
Leukocytes,Ua: NEGATIVE
Nitrite: NEGATIVE
Protein, ur: NEGATIVE mg/dL
Specific Gravity, Urine: 1.01 (ref 1.005–1.030)
pH: 7 (ref 5.0–8.0)

## 2020-06-03 MED ORDER — ACETAMINOPHEN 325 MG PO TABS
650.0000 mg | ORAL_TABLET | Freq: Once | ORAL | Status: AC
  Administered 2020-06-03: 650 mg via ORAL
  Filled 2020-06-03: qty 2

## 2020-06-03 MED ORDER — IOHEXOL 300 MG/ML  SOLN
75.0000 mL | Freq: Once | INTRAMUSCULAR | Status: AC | PRN
  Administered 2020-06-03: 75 mL via INTRAVENOUS

## 2020-06-03 MED ORDER — IOHEXOL 9 MG/ML PO SOLN
1000.0000 mL | Freq: Once | ORAL | Status: AC
  Administered 2020-06-03: 1000 mL via ORAL

## 2020-06-03 NOTE — ED Triage Notes (Signed)
Pt comes into the ED via POV c/o right side groin pain and nausea.  Pain in the groin has been intermittent for a while, but came back last night.  Pt ambulatory to triage at this time. Denies any difficulty urinating or pain with urination.  Pt denies any episode of emesis.  Pt in NAd at this time with even and unlabored respirations.

## 2020-06-03 NOTE — ED Provider Notes (Signed)
-----------------------------------------   11:11 PM on 06/03/2020 -----------------------------------------  Assuming care from Dr. Larinda Buttery.  In short, Pamela Shaffer is a 86 y.o. female with a chief complaint of abdominal pain.  Refer to the original H&P for additional details.  The current plan of care is to follow up on results.  Anticipate discharge in the absence of any acute/emergent findings.    ----------------------------------------- 4:46 AM on 06/04/2020 -----------------------------------------  Reassuring CT scan with no evidence of acute infection or emergent abnormality as per the radiology report.  I reassessed the patient and she is tolerating oral intake.  She is still having some pain and as per the patient and daughter's request I ordered tramadol 100 mg by mouth and provided a prescription for tramadol which she has used in the past.  They will follow-up as an outpatient and they were both eager and ready to go home and agreeable with the plan.  I gave my usual and customary return precautions.   Loleta Rose, MD 06/04/20 [phone]

## 2020-06-03 NOTE — ED Provider Notes (Signed)
-----------------------------------------   9:19 PM on 06/03/2020 -----------------------------------------  Blood pressure (!) 170/65, pulse 88, temperature 99.2 F (37.3 C), temperature source Oral, resp. rate 18, height 5' (1.524 m), weight 59 kg, SpO2 97 %.  Assuming care from Dr. Cyril Loosen.  In short, Pamela Shaffer is a 86 y.o. female with a chief complaint of Groin Pain and Nausea .  Refer to the original H&P for additional details.  The current plan of care is to follow-up CT and UA results for RLQ abdominal pain.  ----------------------------------------- 11:31 PM on 06/03/2020 -----------------------------------------  Patient remains comfortable, awaiting CT results. Patient turned over to oncoming provider pending CT results.    Chesley Noon, MD 06/03/20 [phone]

## 2020-06-03 NOTE — ED Provider Notes (Signed)
[facility] Emergency Department Provider Note   ____________________________________________    I have reviewed the triage vital signs and the nursing notes.   HISTORY  Chief Complaint Groin Pain and Nausea     HPI Pamela Shaffer is a 86 y.o. female with a history of an appendectomy, cholecystectomy who presents with complaints of right lower quadrant abdominal pain.  Patient reports pain started this morning and has been significant.  She reports he had this once before over the summer at that time she had a CT scan which was normal and the pain seemed to subside without intervention.  She reports the pain is primarily only if she moves or "gets stressed out ".  No fevers chills nausea or vomiting.  Past Medical History:  Diagnosis Date  . Arthritis    osteoporosis  . GERD (gastroesophageal reflux disease)   . Hypercholesteremia   . Hypertension     Patient Active Problem List   Diagnosis Date Noted  . Total knee replacement status 11/02/2016    Past Surgical History:  Procedure Laterality Date  . APPENDECTOMY    . CHOLECYSTECTOMY    . EYE SURGERY Bilateral    Cataract Extraction with IOL  . TOTAL KNEE ARTHROPLASTY Right 11/02/2016   Procedure: TOTAL KNEE ARTHROPLASTY;  Surgeon: Deeann Saint, MD;  Location: [facility] ORS;  Service: Orthopedics;  Laterality: Right;  . TOTAL SHOULDER ARTHROPLASTY Right     Prior to Admission medications   Medication Sig Start Date End Date Taking? Authorizing Provider  amLODipine (NORVASC) 2.5 MG tablet Take 2.5 mg by mouth at bedtime.  07/25/16   [provider]  amoxicillin (AMOXIL) 500 MG capsule Take 2,000 mg by mouth See admin instructions. Take 4 capsules 1 hours prior to dental appointments. 09/28/16   [provider]  aspirin EC 325 MG tablet Take 1 tablet (325 mg total) by mouth daily. 11/05/16   Deeann Saint, MD  Calcium Carbonate-Vitamin D (CALTRATE 600+D) 600-400 MG-UNIT tablet Take 2  tablets by mouth daily at 12 noon.    [provider]  DENTA 5000 PLUS 1.1 % CREA dental cream Take 1 application by mouth at bedtime. 09/28/16   [provider]  diclofenac (VOLTAREN) 75 MG EC tablet Take 75 mg by mouth 2 (two) times daily. 09/21/16   [provider]  gabapentin (NEURONTIN) 100 MG capsule Take 100 mg by mouth 3 (three) times daily.    [provider]  HYDROcodone-acetaminophen (NORCO) 5-325 MG tablet Take 1 tablet by mouth every 6 (six) hours as needed. 11/05/16   Deeann Saint, MD  Ibuprofen-Diphenhydramine Cit (ADVIL PM PO) Take 0.5 tablets by mouth at bedtime as needed (sleep).    [provider]  lovastatin (MEVACOR) 20 MG tablet Take 20 mg by mouth daily with supper. 07/25/16   [provider]  omeprazole (PRILOSEC) 20 MG capsule Take 20 mg by mouth daily with supper. 08/26/16   [provider]  Simethicone (PHAZYME ULTRA STRENGTH) 180 MG CAPS Take 1 capsule by mouth daily as needed (gas).    [provider]  traMADol (ULTRAM) 50 MG tablet Take 50 mg by mouth every 6 (six) hours as needed (pain).    [provider]     Allergies Patient has no known allergies.  History reviewed. No pertinent family history.  Social History Social History   Tobacco Use  . Smoking status: Former Smoker    Packs/day: 0.50    Types: Cigarettes  . Smokeless  tobacco: Never Used  Substance Use Topics  . Alcohol use: No  . Drug use: No    Review of Systems  Constitutional: No fever/chills Eyes: No visual changes.  ENT: No complaints Cardiovascular: Denies chest pain. Respiratory: Denies shortness of breath. Gastrointestinal: As above Genitourinary: Negative for dysuria.  No reported hematuria Musculoskeletal: Negative for back pain. Skin: Negative for rash. Neurological: Negative for headaches    ____________________________________________   PHYSICAL EXAM:  VITAL SIGNS: ED Triage Vitals  Enc  Vitals Group     BP 06/03/20 1818 (!) 170/83     Pulse Rate 06/03/20 1818 87     Resp 06/03/20 1818 18     Temp 06/03/20 1818 99.2 F (37.3 C)     Temp Source 06/03/20 1818 Oral     SpO2 06/03/20 1818 96 %     Weight 06/03/20 1819 59 kg (130 lb)     Height 06/03/20 1819 1.524 m (5')     Head Circumference --      Peak Flow --      Pain Score 06/03/20 1819 9     Pain Loc --      Pain Edu? --      Excl. in GC? --     Constitutional: Alert and oriented. No acute distress. Pleasant and interactive  Nose: No congestion/rhinnorhea. Mouth/Throat: Mucous membranes are moist.    Cardiovascular: Normal rate, regular rhythm. Grossly normal heart sounds.  Good peripheral circulation. Respiratory: Normal respiratory effort.  No retractions. Lungs CTAB. Gastrointestinal: Old healed surgical scar just right of midline, soft, minimal tenderness palpation in the right lower quadrant just adjacent to old surgical scar. No distention.  No CVA tenderness. Genitourinary: deferred Musculoskeletal: .  Warm and well perfused Neurologic:  Normal speech and language. No gross focal neurologic deficits are appreciated.  Skin:  Skin is warm, dry and intact. No rash noted. Psychiatric: Mood and affect are normal. Speech and behavior are normal.  ____________________________________________   LABS (all labs ordered are listed, but only abnormal results are displayed)  Labs Reviewed  COMPREHENSIVE METABOLIC PANEL - Abnormal; Notable for the following components:      Result Value   Glucose, Bld 125 (*)    All other components within normal limits  LIPASE, BLOOD  CBC  URINALYSIS, COMPLETE (UACMP) WITH MICROSCOPIC   ____________________________________________  EKG  None ____________________________________________  RADIOLOGY  CT abdomen pelvis ____________________________________________   PROCEDURES  Procedure(s) performed: No  Procedures   Critical Care performed:  No ____________________________________________   INITIAL IMPRESSION / ASSESSMENT AND PLAN / ED COURSE  Pertinent labs & imaging results that were available during my care of the patient were reviewed by me and considered in my medical decision making (see chart for details).  Patient presents with right lower quadrant abdominal pain as described above.  Exam is overall reassuring and lab work demonstrates normal white blood cell count and normal CMP and normal lipase  Differential includes diverticulitis, colitis, discomfort from scar tissue/musculoskeletal pain.  However she is concerned about this pain and she does have mild tenderness to palpation, given her age will obtain CT abdomen pelvis.  I have asked my colleague to follow-up on CT results, anticipate discharge with outpatient follow-up with PCP     ____________________________________________   FINAL CLINICAL IMPRESSION(S) / ED DIAGNOSES  Final diagnoses:  Right lower quadrant abdominal pain        Note:  This document was prepared using Dragon voice recognition software and may include unintentional dictation  errors.   [name], MD 06/03/20 2101

## 2020-06-03 NOTE — ED Notes (Signed)
Pt ambulated to bathroom independently

## 2020-06-04 DIAGNOSIS — R1031 Right lower quadrant pain: Secondary | ICD-10-CM | POA: Diagnosis not present

## 2020-06-04 MED ORDER — KETOROLAC TROMETHAMINE 30 MG/ML IJ SOLN
15.0000 mg | Freq: Once | INTRAMUSCULAR | Status: AC
Start: 2020-06-04 — End: 2020-06-04
  Administered 2020-06-04: 15 mg via INTRAVENOUS
  Filled 2020-06-04: qty 1

## 2020-06-04 MED ORDER — TRAMADOL HCL 50 MG PO TABS: 50.0000 mg | ORAL_TABLET | Freq: Four times a day (QID) | ORAL | 0 refills | Status: DC | PRN

## 2020-06-04 MED ORDER — TRAMADOL HCL 50 MG PO TABS
100.0000 mg | ORAL_TABLET | Freq: Once | ORAL | Status: AC
  Administered 2020-06-04: 100 mg via ORAL
  Filled 2020-06-04: qty 2

## 2020-06-04 NOTE — Discharge Instructions (Signed)

## 2021-02-16 ENCOUNTER — Ambulatory Visit (INDEPENDENT_AMBULATORY_CARE_PROVIDER_SITE_OTHER): Payer: Medicare Other | Admitting: Podiatry

## 2021-02-16 ENCOUNTER — Ambulatory Visit (INDEPENDENT_AMBULATORY_CARE_PROVIDER_SITE_OTHER): Payer: Medicare Other

## 2021-02-16 ENCOUNTER — Encounter: Payer: Self-pay | Admitting: Podiatry

## 2021-02-16 ENCOUNTER — Other Ambulatory Visit: Payer: Self-pay

## 2021-02-16 DIAGNOSIS — M722 Plantar fascial fibromatosis: Secondary | ICD-10-CM

## 2021-02-16 NOTE — Progress Notes (Signed)
Subjective:  Patient ID: Pamela Shaffer, female    DOB: Jul 11, 1926,  MRN: [id number]  No chief complaint on file.   87 y.o. female presents with the above complaint.  Patient presents with complaint of right heel pain.  Patient states is painful to touch.  Patient would like to discuss treatment option hurts with taking ambulation.  She states hurts with first up in the morning and progressively gets worse.  She has tried icing and stretching and none of which has helped.  She would like to discuss treatment options for this   Review of Systems: Negative except as noted in the HPI. Denies N/V/F/Ch.  Past Medical History:  Diagnosis Date   Arthritis    osteoporosis   GERD (gastroesophageal reflux disease)    Hypercholesteremia    Hypertension     Current Outpatient Medications:    amLODipine (NORVASC) 2.5 MG tablet, Take 2.5 mg by mouth at bedtime. , Disp: , Rfl:    amoxicillin (AMOXIL) 500 MG capsule, Take 2,000 mg by mouth See admin instructions. Take 4 capsules 1 hours prior to dental appointments., Disp: , Rfl: 0   aspirin EC 325 MG tablet, Take 1 tablet (325 mg total) by mouth daily., Disp: 30 tablet, Rfl: 0   Calcium Carbonate-Vitamin D (CALTRATE 600+D) 600-400 MG-UNIT tablet, Take 2 tablets by mouth daily at 12 noon., Disp: , Rfl:    DENTA 5000 PLUS 1.1 % CREA dental cream, Take 1 application by mouth at bedtime., Disp: , Rfl: 99   diclofenac (VOLTAREN) 75 MG EC tablet, Take 75 mg by mouth 2 (two) times daily., Disp: , Rfl:    gabapentin (NEURONTIN) 100 MG capsule, Take 100 mg by mouth 3 (three) times daily., Disp: , Rfl:    HYDROcodone-acetaminophen (NORCO) 5-325 MG tablet, Take 1 tablet by mouth every 6 (six) hours as needed., Disp: 40 tablet, Rfl: 0   Ibuprofen-Diphenhydramine Cit (ADVIL PM PO), Take 0.5 tablets by mouth at bedtime as needed (sleep)., Disp: , Rfl:    lovastatin (MEVACOR) 20 MG tablet, Take 20 mg by mouth daily with supper., Disp: , Rfl:    omeprazole (PRILOSEC)  20 MG capsule, Take 20 mg by mouth daily with supper., Disp: , Rfl:    Simethicone (PHAZYME ULTRA STRENGTH) 180 MG CAPS, Take 1 capsule by mouth daily as needed (gas)., Disp: , Rfl:    traMADol (ULTRAM) 50 MG tablet, Take 1 tablet (50 mg total) by mouth every 6 (six) hours as needed for moderate pain or severe pain., Disp: 20 tablet, Rfl: 0  Social History   Tobacco Use  Smoking Status Former   Packs/day: 0.50   Types: Cigarettes  Smokeless Tobacco Never    No Known Allergies Objective:  There were no vitals filed for this visit. There is no height or weight on file to calculate BMI. Constitutional Well developed. Well nourished.  Vascular Dorsalis pedis pulses palpable bilaterally. Posterior tibial pulses palpable bilaterally. Capillary refill normal to all digits.  No cyanosis or clubbing noted. Pedal hair growth normal.  Neurologic Normal speech. Oriented to person, place, and time. Epicritic sensation to light touch grossly present bilaterally.  Dermatologic Nails well groomed and normal in appearance. No open wounds. No skin lesions.  Orthopedic: Normal joint ROM without pain or crepitus bilaterally. No visible deformities. Tender to palpation at the calcaneal tuber right. No pain with calcaneal squeeze right. Ankle ROM diminished range of motion right. Silfverskiold Test: positive right.   Radiographs: Taken and reviewed. No acute fractures or  dislocations. No evidence of stress fracture.  Plantar heel spur present. Posterior heel spur absent.   Assessment:   1. Plantar fasciitis of right foot    Plan:  Patient was evaluated and treated and all questions answered.  Plantar Fasciitis, right - XR reviewed as above.  - Educated on icing and stretching. Instructions given.  - Injection delivered to the plantar fascia as below. - DME: Plantar Fascial Brace - Pharmacologic management: None  Procedure: Injection Tendon/Ligament Location: Right plantar fascia at  the glabrous junction; medial approach. Skin Prep: alcohol Injectate: 0.5 cc 0.5% marcaine plain, 0.5 cc of 1% Lidocaine, 0.5 cc kenalog 10. Disposition: Patient tolerated procedure well. Injection site dressed with a band-aid.  No follow-ups on file.

## 2021-03-23 ENCOUNTER — Encounter (INDEPENDENT_AMBULATORY_CARE_PROVIDER_SITE_OTHER): Payer: Self-pay

## 2021-03-23 ENCOUNTER — Other Ambulatory Visit: Payer: Self-pay

## 2021-03-23 ENCOUNTER — Ambulatory Visit (INDEPENDENT_AMBULATORY_CARE_PROVIDER_SITE_OTHER): Payer: Medicare Other | Admitting: Podiatry

## 2021-03-23 ENCOUNTER — Encounter: Payer: Self-pay | Admitting: Podiatry

## 2021-03-23 DIAGNOSIS — M722 Plantar fascial fibromatosis: Secondary | ICD-10-CM

## 2021-03-23 NOTE — Progress Notes (Signed)
Subjective:  Patient ID: Pamela Shaffer, female    DOB: 1926/01/14,  MRN: [id number]  Chief Complaint  Patient presents with   Plantar Fasciitis    Plantar fasciitis  PT stated that she has some improvement      87 y.o. female presents with the above complaint.  Patient presents for follow-up of right Planter fasciitis.  She states that she is doing a little bit better.  She is about 70% improved.  She would like to know if she can do another injection.  She has been wearing her brace.  She is made some shoe gear modification.  She wears insoles.   Review of Systems: Negative except as noted in the HPI. Denies N/V/F/Ch.  Past Medical History:  Diagnosis Date   Arthritis    osteoporosis   GERD (gastroesophageal reflux disease)    Hypercholesteremia    Hypertension     Current Outpatient Medications:    amLODipine (NORVASC) 2.5 MG tablet, Take 2.5 mg by mouth at bedtime. , Disp: , Rfl:    amoxicillin (AMOXIL) 500 MG capsule, Take 2,000 mg by mouth See admin instructions. Take 4 capsules 1 hours prior to dental appointments., Disp: , Rfl: 0   aspirin EC 325 MG tablet, Take 1 tablet (325 mg total) by mouth daily., Disp: 30 tablet, Rfl: 0   Calcium Carbonate-Vitamin D (CALTRATE 600+D) 600-400 MG-UNIT tablet, Take 2 tablets by mouth daily at 12 noon., Disp: , Rfl:    DENTA 5000 PLUS 1.1 % CREA dental cream, Take 1 application by mouth at bedtime., Disp: , Rfl: 99   diclofenac (VOLTAREN) 75 MG EC tablet, Take 75 mg by mouth 2 (two) times daily., Disp: , Rfl:    gabapentin (NEURONTIN) 100 MG capsule, Take 100 mg by mouth 3 (three) times daily., Disp: , Rfl:    HYDROcodone-acetaminophen (NORCO) 5-325 MG tablet, Take 1 tablet by mouth every 6 (six) hours as needed., Disp: 40 tablet, Rfl: 0   Ibuprofen-Diphenhydramine Cit (ADVIL PM PO), Take 0.5 tablets by mouth at bedtime as needed (sleep)., Disp: , Rfl:    lovastatin (MEVACOR) 20 MG tablet, Take 20 mg by mouth daily with supper., Disp: , Rfl:     omeprazole (PRILOSEC) 20 MG capsule, Take 20 mg by mouth daily with supper., Disp: , Rfl:    Simethicone (PHAZYME ULTRA STRENGTH) 180 MG CAPS, Take 1 capsule by mouth daily as needed (gas)., Disp: , Rfl:    traMADol (ULTRAM) 50 MG tablet, Take 1 tablet (50 mg total) by mouth every 6 (six) hours as needed for moderate pain or severe pain., Disp: 20 tablet, Rfl: 0  Social History   Tobacco Use  Smoking Status Former   Packs/day: 0.50   Types: Cigarettes  Smokeless Tobacco Never    No Known Allergies Objective:  There were no vitals filed for this visit. There is no height or weight on file to calculate BMI. Constitutional Well developed. Well nourished.  Vascular Dorsalis pedis pulses palpable bilaterally. Posterior tibial pulses palpable bilaterally. Capillary refill normal to all digits.  No cyanosis or clubbing noted. Pedal hair growth normal.  Neurologic Normal speech. Oriented to person, place, and time. Epicritic sensation to light touch grossly present bilaterally.  Dermatologic Nails well groomed and normal in appearance. No open wounds. No skin lesions.  Orthopedic: Normal joint ROM without pain or crepitus bilaterally. No visible deformities. Tender to palpation at the calcaneal tuber right. No pain with calcaneal squeeze right. Ankle ROM diminished range of motion right. Silfverskiold  Test: positive right.   Radiographs: Taken and reviewed. No acute fractures or dislocations. No evidence of stress fracture.  Plantar heel spur present. Posterior heel spur absent.   Assessment:   1. Plantar fasciitis of right foot     Plan:  Patient was evaluated and treated and all questions answered.  Plantar Fasciitis, right - XR reviewed as above.  - Educated on icing and stretching. Instructions given.  -Second injection delivered to the plantar fascia as below. - DME: Continue wearing plantar Fascial Brace - Pharmacologic management: None  Procedure: Injection  Tendon/Ligament Location: Right plantar fascia at the glabrous junction; medial approach. Skin Prep: alcohol Injectate: 0.5 cc 0.5% marcaine plain, 0.5 cc of 1% Lidocaine, 0.5 cc kenalog 10. Disposition: Patient tolerated procedure well. Injection site dressed with a band-aid.  No follow-ups on file.

## 2021-12-18 ENCOUNTER — Emergency Department: Payer: Medicare Other

## 2021-12-18 ENCOUNTER — Inpatient Hospital Stay
Admission: EM | Admit: 2021-12-18 | Discharge: 2021-12-21 | DRG: 481 | Disposition: A | Discharge status: Skilled Nursing Facility | Payer: Medicare Other | Attending: Internal Medicine | Admitting: Internal Medicine

## 2021-12-18 ENCOUNTER — Other Ambulatory Visit: Payer: Self-pay

## 2021-12-18 ENCOUNTER — Encounter: Payer: Self-pay | Admitting: Internal Medicine

## 2021-12-18 DIAGNOSIS — Z79899 Other long term (current) drug therapy: Secondary | ICD-10-CM

## 2021-12-18 DIAGNOSIS — Z6824 Body mass index (BMI) 24.0-24.9, adult: Secondary | ICD-10-CM | POA: Diagnosis not present

## 2021-12-18 DIAGNOSIS — Z825 Family history of asthma and other chronic lower respiratory diseases: Secondary | ICD-10-CM

## 2021-12-18 DIAGNOSIS — D638 Anemia in other chronic diseases classified elsewhere: Secondary | ICD-10-CM | POA: Diagnosis present

## 2021-12-18 DIAGNOSIS — D62 Acute posthemorrhagic anemia: Secondary | ICD-10-CM

## 2021-12-18 DIAGNOSIS — W010XXA Fall on same level from slipping, tripping and stumbling without subsequent striking against object, initial encounter: Secondary | ICD-10-CM | POA: Diagnosis present

## 2021-12-18 DIAGNOSIS — Z8261 Family history of arthritis: Secondary | ICD-10-CM | POA: Diagnosis not present

## 2021-12-18 DIAGNOSIS — S72141A Displaced intertrochanteric fracture of right femur, initial encounter for closed fracture: Secondary | ICD-10-CM | POA: Diagnosis present

## 2021-12-18 DIAGNOSIS — Z96651 Presence of right artificial knee joint: Secondary | ICD-10-CM | POA: Diagnosis present

## 2021-12-18 DIAGNOSIS — Z87891 Personal history of nicotine dependence: Secondary | ICD-10-CM | POA: Diagnosis not present

## 2021-12-18 DIAGNOSIS — I10 Essential (primary) hypertension: Secondary | ICD-10-CM | POA: Diagnosis present

## 2021-12-18 DIAGNOSIS — D509 Iron deficiency anemia, unspecified: Secondary | ICD-10-CM | POA: Diagnosis present

## 2021-12-18 DIAGNOSIS — R54 Age-related physical debility: Secondary | ICD-10-CM | POA: Diagnosis present

## 2021-12-18 DIAGNOSIS — Y92008 Other place in unspecified non-institutional (private) residence as the place of occurrence of the external cause: Secondary | ICD-10-CM | POA: Diagnosis not present

## 2021-12-18 DIAGNOSIS — E440 Moderate protein-calorie malnutrition: Secondary | ICD-10-CM | POA: Diagnosis present

## 2021-12-18 DIAGNOSIS — Z8249 Family history of ischemic heart disease and other diseases of the circulatory system: Secondary | ICD-10-CM

## 2021-12-18 DIAGNOSIS — Z96611 Presence of right artificial shoulder joint: Secondary | ICD-10-CM | POA: Diagnosis present

## 2021-12-18 DIAGNOSIS — E7800 Pure hypercholesterolemia, unspecified: Secondary | ICD-10-CM | POA: Diagnosis present

## 2021-12-18 DIAGNOSIS — M810 Age-related osteoporosis without current pathological fracture: Secondary | ICD-10-CM | POA: Diagnosis present

## 2021-12-18 DIAGNOSIS — S72001A Fracture of unspecified part of neck of right femur, initial encounter for closed fracture: Secondary | ICD-10-CM | POA: Diagnosis not present

## 2021-12-18 DIAGNOSIS — E785 Hyperlipidemia, unspecified: Secondary | ICD-10-CM | POA: Diagnosis present

## 2021-12-18 DIAGNOSIS — E782 Mixed hyperlipidemia: Secondary | ICD-10-CM | POA: Diagnosis not present

## 2021-12-18 DIAGNOSIS — Z96659 Presence of unspecified artificial knee joint: Secondary | ICD-10-CM

## 2021-12-18 DIAGNOSIS — Z7982 Long term (current) use of aspirin: Secondary | ICD-10-CM

## 2021-12-18 DIAGNOSIS — K219 Gastro-esophageal reflux disease without esophagitis: Secondary | ICD-10-CM | POA: Diagnosis present

## 2021-12-18 LAB — COMPREHENSIVE METABOLIC PANEL
ALT: 12 U/L (ref 0–44)
AST: 21 U/L (ref 15–41)
Albumin: 4 g/dL (ref 3.5–5.0)
Alkaline Phosphatase: 43 U/L (ref 38–126)
Anion gap: 6 (ref 5–15)
BUN: 26 mg/dL — ABNORMAL HIGH (ref 8–23)
CO2: 29 mmol/L (ref 22–32)
Calcium: 9.4 mg/dL (ref 8.9–10.3)
Chloride: 105 mmol/L (ref 98–111)
Creatinine, Ser: 0.74 mg/dL (ref 0.44–1.00)
GFR, Estimated: >60 mL/min (ref >60)
Glucose, Bld: 132 mg/dL — ABNORMAL HIGH (ref 70–99)
Potassium: 3.6 mmol/L (ref 3.5–5.1)
Sodium: 140 mmol/L (ref 135–145)
Total Bilirubin: 1.1 mg/dL (ref 0.3–1.2)
Total Protein: 6.6 g/dL (ref 6.5–8.1)

## 2021-12-18 LAB — CBC
HCT: 35.9 % — ABNORMAL LOW (ref 36.0–46.0)
Hemoglobin: 11.5 g/dL — ABNORMAL LOW (ref 12.0–15.0)
MCH: 30 pg (ref 26.0–34.0)
MCHC: 32 g/dL (ref 30.0–36.0)
MCV: 93.7 fL (ref 80.0–100.0)
Platelets: 157 10*3/uL (ref 150–400)
RBC: 3.83 MIL/uL — ABNORMAL LOW (ref 3.87–5.11)
RDW: 12.6 % (ref 11.5–15.5)
WBC: 6.3 10*3/uL (ref 4.0–10.5)
nRBC: 0.3 % — ABNORMAL HIGH (ref 0.0–0.2)

## 2021-12-18 LAB — SAMPLE TO BLOOD BANK

## 2021-12-18 MED ORDER — CEFAZOLIN SODIUM-DEXTROSE 2-4 GM/100ML-% IV SOLN
2.0000 g | INTRAVENOUS | Status: AC
  Administered 2021-12-19: 1 g via INTRAVENOUS
  Filled 2021-12-18: qty 100

## 2021-12-18 MED ORDER — HYDRALAZINE HCL 20 MG/ML IJ SOLN: 5.0000 mg | Freq: Four times a day (QID) | INTRAMUSCULAR | Status: DC | PRN

## 2021-12-18 MED ORDER — PRAVASTATIN SODIUM 20 MG PO TABS
20.0000 mg | ORAL_TABLET | Freq: Every day | ORAL | Status: DC
  Administered 2021-12-18 – 2021-12-20 (×3): 20 mg via ORAL
  Filled 2021-12-18 (×3): qty 1

## 2021-12-18 MED ORDER — TRANEXAMIC ACID-NACL 1000-0.7 MG/100ML-% IV SOLN
1000.0000 mg | INTRAVENOUS | Status: AC
Start: 2021-12-19 — End: 2021-12-19
  Administered 2021-12-19: 1000 mg via INTRAVENOUS

## 2021-12-18 MED ORDER — POLYETHYLENE GLYCOL 3350 17 G PO PACK
17.0000 g | PACK | Freq: Every day | ORAL | Status: DC | PRN
  Administered 2021-12-20 – 2021-12-21 (×2): 17 g via ORAL
  Filled 2021-12-18 (×3): qty 1

## 2021-12-18 MED ORDER — PANTOPRAZOLE SODIUM 40 MG PO TBEC
40.0000 mg | DELAYED_RELEASE_TABLET | Freq: Every day | ORAL | Status: DC
  Administered 2021-12-20 – 2021-12-21 (×2): 40 mg via ORAL
  Filled 2021-12-18 (×3): qty 1

## 2021-12-18 MED ORDER — VITAMIN B-12 1000 MCG PO TABS
1000.0000 ug | ORAL_TABLET | Freq: Every day | ORAL | Status: DC
  Administered 2021-12-18 – 2021-12-21 (×3): 1000 ug via ORAL
  Filled 2021-12-18 (×4): qty 1

## 2021-12-18 MED ORDER — HEPARIN SODIUM (PORCINE) 5000 UNIT/ML IJ SOLN
5000.0000 [IU] | Freq: Three times a day (TID) | INTRAMUSCULAR | Status: AC
  Administered 2021-12-18: 5000 [IU] via SUBCUTANEOUS
  Filled 2021-12-18: qty 1

## 2021-12-18 MED ORDER — MORPHINE SULFATE (PF) 2 MG/ML IV SOLN
0.5000 mg | INTRAVENOUS | Status: DC | PRN
  Administered 2021-12-19: 0.5 mg via INTRAVENOUS
  Filled 2021-12-18: qty 1

## 2021-12-18 MED ORDER — AMLODIPINE BESYLATE 10 MG PO TABS
10.0000 mg | ORAL_TABLET | Freq: Every day | ORAL | Status: DC
  Administered 2021-12-18 – 2021-12-21 (×3): 10 mg via ORAL
  Filled 2021-12-18 (×3): qty 1

## 2021-12-18 MED ORDER — FENTANYL CITRATE PF 50 MCG/ML IJ SOSY
50.0000 ug | PREFILLED_SYRINGE | Freq: Once | INTRAMUSCULAR | Status: AC
  Administered 2021-12-18: 50 ug via INTRAVENOUS
  Filled 2021-12-18: qty 1

## 2021-12-18 MED ORDER — HYDROCODONE-ACETAMINOPHEN 5-325 MG PO TABS
1.0000 | ORAL_TABLET | Freq: Four times a day (QID) | ORAL | Status: DC | PRN
  Administered 2021-12-18: 2 via ORAL
  Administered 2021-12-19: 1 via ORAL
  Administered 2021-12-19: 2 via ORAL
  Administered 2021-12-20 – 2021-12-21 (×4): 1 via ORAL
  Filled 2021-12-18 (×3): qty 1
  Filled 2021-12-18: qty 2
  Filled 2021-12-18 (×2): qty 1
  Filled 2021-12-18: qty 2

## 2021-12-18 NOTE — Assessment & Plan Note (Addendum)
-   Presumed secondary to mechanical fall - Pain control: Norco 5-325 mg, 1 to 2 tablets every 6 hours.  For moderate pain, morphine 0.5 mg IV every 2 hours as needed for severe pain - Plan for operative repair today.  Awaiting post op recommendations.

## 2021-12-18 NOTE — Assessment & Plan Note (Signed)
-   Pravastatin 20 mg daily resumed 

## 2021-12-18 NOTE — H&P (Signed)
History and Physical   Pamela Shaffer PYK:[id number] DOB: [birth year] DOA: 12/18/2021  PCP: Gracelyn Nurse, MD  Outpatient Specialists: Dr. Gwen Pounds, cardiology Patient coming from: Home via EMS  I have personally briefly reviewed patient's old medical records in [facility] EMR.  Chief Concern: Mechanical fall  HPI: Ms. Pamela Shaffer is a 88 year old female with history of hypertension, hyperlipidemia, GERD, who presents emergency department for chief concerns of mechanical fall and right hip pain.  Initial vitals in the emergency department show temperature of 98.2, respiration rate of 14, heart rate of 69, blood pressure 161/64.  Serum sodium is 140, potassium 3.6, chloride of 105, bicarb of 29, BUN of 26, serum creatinine of 0.74, GFR greater than 60, nonfasting blood glucose 132, WBC 6.3, hemoglobin 11.5, platelets of 157.  Imaging: Right hip with pelvis x-ray was read as comminuted angulated right hip fracture through the intertrochanteric region without dislocation.  No other acute abnormalities.  ED treatment: Fentanyl 50 mcg IV one-time dose.  At bedside patient was able to clearly tell me her name, her age, she knows she is in the hospital, she knows her daughter at bedside and she knows the current calendar year.  She reports that she had just had lunch and was sitting down.  She felt the need to have a bowel movement and as she was standing up she lost her balance and fell.  She denies head trauma, and loss of consciousness.  She did have a regular bowel movement as she is laying there on the floor waiting for EMS.  Prior to falling she denies chest pain, shortness of breath, abdominal pain, vision changes, headaches, fever, seizure activity.  Social history: She lives at home by herself and is able to perform her own ADLs.  She denies tobacco, EtOH, recreational drug use.  ROS: Constitutional: no weight change, no fever ENT/Mouth: no sore throat, no rhinorrhea Eyes: no eye  pain, no vision changes Cardiovascular: no chest pain, no dyspnea,  no edema, no palpitations Respiratory: no cough, no sputum, no wheezing Gastrointestinal: no nausea, no vomiting, no diarrhea, no constipation Genitourinary: no urinary incontinence, no dysuria, no hematuria Musculoskeletal: no arthralgias, no myalgias Skin: no skin lesions, no pruritus, Neuro: + weakness, no loss of consciousness, no syncope Psych: no anxiety, no depression, no decrease appetite Heme/Lymph: no bruising, no bleeding  ED Course: Discussed with emergency medicine provider, patient requiring hospitalization for chief concerns of right hip fracture.  Assessment/Plan  Principal Problem:   Closed right hip fracture (HCC) Active Problems:   Total knee replacement status   Essential hypertension   Hyperlipidemia   GERD (gastroesophageal reflux disease)   Osteoporosis   Assessment and Plan:  * Closed right hip fracture (HCC) - Presumed secondary to mechanical fall - Pain control: Norco 5-325 mg, 1 to 2 tablets every 6 hours.  For moderate pain, morphine 0.5 mg IV every 2 hours as needed for severe pain - Orthopedic service has been consulted by EDP, Dr. Odis Luster states he will see the patient with possible OR time in the a.m. - N.p.o. after midnight, except for sips with meds - Admit to MedSurg, inpatient  GERD (gastroesophageal reflux disease) - PPI resumed  Hyperlipidemia - Pravastatin 20 mg daily resumed  Essential hypertension - Amlodipine 10 mg daily resumed  DVT prophylaxis: Heparin 5000 units subcutaneous one-time dose has been ordered - AM team to reinitiate pharmacologic DVT prophylaxis when the benefits outweigh the risk  Chart reviewed.   DVT prophylaxis: Heparin 5000 units,  one-time dose; TED hose Code Status: Full code Diet: Heart healthy now, n.p.o. after midnight Family Communication: Updated daughter Remigio Eisenmengereddy at bedside Disposition Plan: Pending orthopedic evaluation Consults  called: Orthopedic service Admission status: MedSurg, inpatient  Past Medical History:  Diagnosis Date   Arthritis    osteoporosis   GERD (gastroesophageal reflux disease)    Hypercholesteremia    Hypertension    Past Surgical History:  Procedure Laterality Date   APPENDECTOMY     CHOLECYSTECTOMY     EYE SURGERY Bilateral    Cataract Extraction with IOL   TOTAL KNEE ARTHROPLASTY Right 11/02/2016   Procedure: TOTAL KNEE ARTHROPLASTY;  Surgeon: Deeann SaintHoward Miller, MD;  Location: [facility] ORS;  Service: Orthopedics;  Laterality: Right;   TOTAL SHOULDER ARTHROPLASTY Right    Social History:  reports that she has quit smoking. Her smoking use included cigarettes. She smoked an average of .5 packs per day. She has never used smokeless tobacco. She reports that she does not drink alcohol and does not use drugs.  No Known Allergies Family History  Problem Relation Age of Onset   Rheum arthritis Mother    Hypertension Mother    Asthma Father    Hypertension Sister    Family history: Family history reviewed and not pertinent  Prior to Admission medications   Medication Sig Start Date End Date Taking? Authorizing Provider  amLODipine (NORVASC) 2.5 MG tablet Take 2.5 mg by mouth at bedtime.  07/25/16   [provider]  amoxicillin (AMOXIL) 500 MG capsule Take 2,000 mg by mouth See admin instructions. Take 4 capsules 1 hours prior to dental appointments. 09/28/16   [provider]  aspirin EC 325 MG tablet Take 1 tablet (325 mg total) by mouth daily. 11/05/16   Deeann SaintMiller, Howard, MD  Calcium Carbonate-Vitamin D (CALTRATE 600+D) 600-400 MG-UNIT tablet Take 2 tablets by mouth daily at 12 noon.    [provider]  DENTA 5000 PLUS 1.1 % CREA dental cream Take 1 application by mouth at bedtime. 09/28/16   [provider]  diclofenac (VOLTAREN) 75 MG EC tablet Take 75 mg by mouth 2 (two) times daily. 09/21/16   [provider]  gabapentin (NEURONTIN) 100 MG capsule Take  100 mg by mouth 3 (three) times daily.    [provider]  HYDROcodone-acetaminophen (NORCO) 5-325 MG tablet Take 1 tablet by mouth every 6 (six) hours as needed. 11/05/16   Deeann SaintMiller, Howard, MD  Ibuprofen-Diphenhydramine Cit (ADVIL PM PO) Take 0.5 tablets by mouth at bedtime as needed (sleep).    [provider]  lovastatin (MEVACOR) 20 MG tablet Take 20 mg by mouth daily with supper. 07/25/16   [provider]  omeprazole (PRILOSEC) 20 MG capsule Take 20 mg by mouth daily with supper. 08/26/16   [provider]  Simethicone (PHAZYME ULTRA STRENGTH) 180 MG CAPS Take 1 capsule by mouth daily as needed (gas).    [provider]  traMADol (ULTRAM) 50 MG tablet Take 1 tablet (50 mg total) by mouth every 6 (six) hours as needed for moderate pain or severe pain. 06/04/20   Loleta RoseForbach, Cory, MD   Physical Exam: Vitals:   12/18/21 1443 12/18/21 1446 12/18/21 1716 12/18/21 1718  BP:  (!) 181/64 (!) 155/49   Pulse:  69 78   Resp:  14 17   Temp:  98.2 F (36.8 C)  98.5 F (36.9 C)  TempSrc:    Oral  SpO2:  99% 98%   Weight: 57.6 kg  Constitutional: appears age-appropriate, frail, NAD, calm, comfortable Eyes: PERRL, lids and conjunctivae normal ENMT: Mucous membranes are moist. Posterior pharynx clear of any exudate or lesions. Age-appropriate dentition. Hearing appropriate Neck: normal, supple, no masses, no thyromegaly Respiratory: clear to auscultation bilaterally, no wheezing, no crackles. Normal respiratory effort. No accessory muscle use.  Cardiovascular: Regular rate and rhythm, no murmurs / rubs / gallops. No extremity edema. 2+ pedal pulses. No carotid bruits.  Abdomen: no tenderness, no masses palpated, no hepatosplenomegaly. Bowel sounds positive.  Musculoskeletal: no clubbing / cyanosis. No joint deformity upper and lower extremities. Good ROM of bilateral upper and left lower extremities, no contractures, no atrophy. Normal muscle tone.  Decreased  range of motion of the right lower extremity.  Right hip is externally rotated and has pain with deep palpation Skin: no rashes, lesions, ulcers. No induration Neurologic: Sensation intact. Strength 5/5 in all 4.  Psychiatric: Normal judgment and insight. Alert and oriented x 3. Normal mood.   EKG: independently reviewed, showing atrial fibrillation with rate of atrial fibrillation with rate of 70, QTc 481  Chest x-ray on Admission: I personally reviewed and I agree with radiologist reading as below.  DG Chest 1 View  Result Date: 12/18/2021 CLINICAL DATA:  Fall.  Preoperative study. EXAM: CHEST  1 VIEW COMPARISON:  April 04, 2018 FINDINGS: Right shoulder replacement. The heart, hila, and mediastinum are unremarkable. No pneumothorax. No nodules or masses. No focal infiltrates. IMPRESSION: No active disease. Electronically Signed   By: Gerome Sam III M.D.   On: 12/18/2021 15:59   CT HEAD WO CONTRAST ( )  Result Date: 12/18/2021 CLINICAL DATA:  Head trauma, minor (Age >= 65y) EXAM: CT HEAD WITHOUT CONTRAST TECHNIQUE: Contiguous axial images were obtained from the base of the skull through the vertex without intravenous contrast. RADIATION DOSE REDUCTION: This exam was performed according to the departmental dose-optimization program which includes automated exposure control, adjustment of the mA and/or kV according to patient size and/or use of iterative reconstruction technique. COMPARISON:  CT dated May 18, 2012 FINDINGS: Brain: No evidence of acute infarction, hemorrhage, hydrocephalus, extra-axial collection or mass lesion/mass effect. Periventricular white matter hypodensities consistent with sequela of chronic microvascular ischemic disease. Vascular: No hyperdense vessel or unexpected calcification. Skull: Normal. Negative for fracture or focal lesion. Sinuses/Orbits: No acute finding. Other: None. IMPRESSION: No acute intracranial abnormality. Electronically Signed   By: Meda Klinefelter M.D.   On: 12/18/2021 15:37   DG HIP UNILAT W OR W/O PELVIS 2-3 VIEWS RIGHT  Result Date: 12/18/2021 CLINICAL DATA:  Fall. EXAM: DG HIP (WITH OR WITHOUT PELVIS) 2-3V RIGHT COMPARISON:  CT of the abdomen and pelvis June 03, 2020 FINDINGS: There is a comminuted angulated right hip fracture through the intertrochanteric region without dislocation. There is a healed right inferior pubic ramus fracture, unchanged since the comparison CT scan. Degenerative changes in the left hip. No other abnormalities. IMPRESSION: Comminuted angulated right hip fracture through the intertrochanteric region without dislocation. No other acute abnormalities. Electronically Signed   By: Gerome Sam III M.D.   On: 12/18/2021 15:58    Labs on Admission: I have personally reviewed following labs  CBC: Recent Labs  Lab 12/18/21 1512  WBC 6.3  HGB 11.5*  HCT 35.9*  MCV 93.7  PLT 157   Basic Metabolic Panel: Recent Labs  Lab 12/18/21 1512  NA 140  K 3.6  CL 105  CO2 29  GLUCOSE 132*  BUN 26*  CREATININE 0.74  CALCIUM 9.4  GFR: CrCl cannot be calculated (Unknown ideal weight.).  Liver Function Tests: Recent Labs  Lab 12/18/21 1512  AST 21  ALT 12  ALKPHOS 43  BILITOT 1.1  PROT 6.6  ALBUMIN 4.0   Urine analysis:    Component Value Date/Time   COLORURINE STRAW (A) 06/03/2020 1821   APPEARANCEUR CLEAR (A) 06/03/2020 1821   APPEARANCEUR Clear 05/18/2012 0911   LABSPEC 1.010 06/03/2020 1821   LABSPEC 1.010 05/18/2012 0911   PHURINE 7.0 06/03/2020 1821   GLUCOSEU NEGATIVE 06/03/2020 1821   GLUCOSEU Negative 05/18/2012 0911   HGBUR NEGATIVE 06/03/2020 1821   BILIRUBINUR NEGATIVE 06/03/2020 1821   BILIRUBINUR Negative 05/18/2012 0911   KETONESUR 20 (A) 06/03/2020 1821   PROTEINUR NEGATIVE 06/03/2020 1821   NITRITE NEGATIVE 06/03/2020 1821   LEUKOCYTESUR NEGATIVE 06/03/2020 1821   LEUKOCYTESUR Negative 05/18/2012 0911   Dr. Sedalia Muta [facility]  If 7PM-7AM, please  contact overnight-coverage provider If 7AM-7PM, please contact day coverage provider [email or web address]  12/18/2021, 6:40 PM

## 2021-12-18 NOTE — ED Provider Notes (Signed)
[facility] Provider Note    Event Date/Time   First MD Initiated Contact with Patient 12/18/21 1506     (approximate)  History   Chief Complaint: Fall  HPI  Pamela Shaffer is a 88 y.o. female with a past medical history of arthritis, hypertension, hyperlipidemia presents to the emergency department after a fall with right hip pain.  According to the patient she was getting up from her chair when she tripped falling onto her right side.  Patient states she was unable to stand but was able to crawl to a phone to call for help.  Patient lives alone, very high functioning and independent.  No anticoagulation.  Does not recall hitting her head.  No LOC.  States moderate pain with movement of the right lower extremity in the right hip only.  Physical Exam   Triage Vital Signs: ED Triage Vitals  Enc Vitals Group     BP 12/18/21 1446 (!) 181/64     Pulse Rate 12/18/21 1446 69     Resp 12/18/21 1446 14     Temp 12/18/21 1446 98.2 F (36.8 C)     Temp src --      SpO2 12/18/21 1441 98 %     Weight 12/18/21 1443 127 lb (57.6 kg)     Height --      Head Circumference --      Peak Flow --      Pain Score 12/18/21 1443 4     Pain Loc --      Pain Edu? --      Excl. in [place]? --     Most recent vital signs: Vitals:   12/18/21 1441 12/18/21 1446  BP:  (!) 181/64  Pulse:  69  Resp:  14  Temp:  98.2 F (36.8 C)  SpO2: 98% 99%    General: Awake, no distress.  CV:  Good peripheral perfusion.  Regular rate and rhythm  Resp:  Normal effort.  Equal breath sounds bilaterally.  Abd:  No distention.  Soft, nontender.  No rebound or guarding. Other:  Shortened and externally rotated right lower extremity neurovascular intact with 2+ DP pulse.   ED Results / Procedures / Treatments   EKG  EKG viewed and interpreted by myself shows what looks most consistent with a sinus rhythm at 70 bpm with a slightly widened QRS, normal axis, normal intervals, nonspecific ST  changes.  RADIOLOGY  I personally evaluated the x-ray image of the hip patient appears to have a fairly comminuted right intertrochanteric fracture. Radiology confirms comminuted right intertrochanteric fracture. CT scan of the head shows no acute abnormality. Chest x-ray shows no acute abnormality.   MEDICATIONS ORDERED IN ED: Medications  fentaNYL (SUBLIMAZE) injection 50 mcg (has no administration in time range)     IMPRESSION / MDM / ASSESSMENT AND PLAN / ED COURSE  I reviewed the triage vital signs and the nursing notes.  Patient's presentation is most consistent with acute presentation with potential threat to life or bodily function.  Patient presents emergency department right hip pain after a fall at home.  Lives alone very high functioning, independent.  Has a shortened and externally rotated right lower extremity consistent with fracture.  We will check labs and chest x-ray for preop purposes get a right hip x-ray.  We will treat pain.  Obtain a CT scan of the head given the fall although no LOC or head injury reported per patient.  Patient's labs have resulted  showing a reassuring chemistry, overall reassuring CBC as well.  X-ray confirms right intertrochanteric fracture.  CT scan head and chest x-ray are negative.  I spoke to Dr. Harlow Mares of orthopedic surgery who will likely proceed with surgery tomorrow.  We will admit to the hospital service for further treatment and management.  Patient and family updated.  Agreeable to plan.  FINAL CLINICAL IMPRESSION(S) / ED DIAGNOSES   Right hip fracture   Note:  This document was prepared using Dragon voice recognition software and may include unintentional dictation errors.   Harvest Dark, MD 12/18/21 1659

## 2021-12-18 NOTE — Anesthesia Preprocedure Evaluation (Addendum)
Anesthesia Evaluation  Patient identified by MRN, date of birth, ID band Patient awake    Reviewed: Allergy & Precautions, NPO status , Patient's Chart, lab work & pertinent test results  History of Anesthesia Complications Negative for: history of anesthetic complications  Airway Mallampati: II  TM Distance: >3 FB Neck ROM: Full    Dental  (+) Chipped   Pulmonary neg pulmonary ROS, neg sleep apnea, neg COPD, Patient abstained from smoking.Not current smoker, former smoker,    Pulmonary exam normal breath sounds clear to auscultation       Cardiovascular Exercise Tolerance: Good METShypertension, Pt. on medications (-) CAD and (-) Past MI (-) dysrhythmias  Rhythm:Regular Rate:Normal - Systolic murmurs 02/15/18 ECG: EKG: independently reviewed by admitting physician, showing atrial fibrillation with rate of atrial fibrillation with rate of 70, QTc 481  Independent with ADLs   Neuro/Psych negative neurological ROS  negative psych ROS   GI/Hepatic GERD  Medicated and Controlled,(+)     (-) substance abuse  ,   Endo/Other  neg diabetes  Renal/GU negative Renal ROS     Musculoskeletal  (+) Arthritis ,   Abdominal   Peds  Hematology   Anesthesia Other Findings S/p Mech Fall with hi fracture Past Medical History: No date: Arthritis     Comment:  osteoporosis No date: GERD (gastroesophageal reflux disease) No date: Hypercholesteremia No date: Hypertension   Reproductive/Obstetrics                            Anesthesia Physical Anesthesia Plan  ASA: 2  Anesthesia Plan: Spinal   Post-op Pain Management:    Induction: Intravenous  PONV Risk Score and Plan: 2 and Ondansetron, TIVA, Propofol infusion and Treatment may vary due to age or medical condition  Airway Management Planned: Natural Airway  Additional Equipment: None  Intra-op Plan:   Post-operative Plan: Extubation in  OR  Informed Consent: I have reviewed the patients History and Physical, chart, labs and discussed the procedure including the risks, benefits and alternatives for the proposed anesthesia with the patient or authorized representative who has indicated his/her understanding and acceptance.     History available from chart only  Plan Discussed with: CRNA, Anesthesiologist and Surgeon  Anesthesia Plan Comments: (Denies anticoagulant use. Discussed R/B/A of neuraxial anesthesia technique with patient: - rare risks of spinal/epidural hematoma, nerve damage, infection - Risk of PDPH - Risk of nausea and vomiting - Risk of conversion to general anesthesia and its associated risks, including sore throat, damage to lips/eyes/teeth/oropharynx, and rare risks such as cardiac and respiratory events. - Risk of allergic reactions  Discussed the role of CRNA in patient's perioperative care.  Patient voiced understanding.)       Anesthesia Quick Evaluation

## 2021-12-18 NOTE — ED Triage Notes (Signed)
Pt had mechanical fell 15 mins pta. R hip pain. Pt R leg pronated and shortened. Pt alert and oriented on arrival.

## 2021-12-18 NOTE — Hospital Course (Addendum)
Pamela Shaffer is a 88 year old female with history of hypertension, hyperlipidemia, GERD, who presents emergency department for chief concerns of mechanical fall and right hip pain.  Initial vitals in the emergency department show temperature of 98.2, respiration rate of 14, heart rate of 69, blood pressure 161/64.  Serum sodium is 140, potassium 3.6, chloride of 105, bicarb of 29, BUN of 26, serum creatinine of 0.74, GFR greater than 60, nonfasting blood glucose 132, WBC 6.3, hemoglobin 11.5, platelets of 157.  Imaging: Right hip with pelvis x-ray was read as comminuted angulated right hip fracture through the intertrochanteric region without dislocation.  No other acute abnormalities.  ED treatment: Fentanyl 50 mcg IV one-time dose.  6/5: S/p right intramedullary nail placement by orthopedic surgery on 12/19/2021.  Tolerated the procedure well.  No significant blood loss noted but hemoglobin dropped to 6.5 this morning, it was 11.5 on admission and 8.2 next day.  Most likely significant bleeding with hip fracture. 2 unit of PRBC ordered due to the significant drop.  Anemia panel with some iron deficiency. Blood bank is giving hard time giving 2 units, which is required due to active bleeding and a significant drop in hemoglobin in 1 day.  PT/OT are recommending SNF.  6/6: Hemoglobin improved to 8.8 after getting 2 unit of PRBC yesterday.  CT hip with a small hematoma.  Does not match with the hemoglobin drop, most likely blood loss during surgery. Anemia panel with anemia of chronic disease along with some iron deficiency.  Started on iron supplement. Patient is being discharged to rehab for further recommendations. She will need a monitoring of her hemoglobin. She will follow-up with her orthopedic surgeon for further recommendations in terms of her hip fracture and postsurgical care. Patient has very high risk for mortality in 1 year based on her age, prior comorbidities and recent hip  fracture.  She will continue with current medications and follow-up with her providers.

## 2021-12-18 NOTE — Assessment & Plan Note (Signed)
-   PPI resumed °

## 2021-12-18 NOTE — ED Notes (Signed)
Pt to xray

## 2021-12-18 NOTE — Consult Note (Signed)
ORTHOPAEDIC CONSULTATION  REQUESTING PHYSICIAN: Cox, Amy N, DO  Chief Complaint: right hip pain  HPI: Pamela Shaffer is a 88 y.o. female who complains of right  hip pain. The pain is sharp in character. The pain is severe and 10/10. The pain is worse with movement and better with rest. Denies any numbness, tingling or constitutional symptoms.  Past Medical History:  Diagnosis Date   Arthritis    osteoporosis   GERD (gastroesophageal reflux disease)    Hypercholesteremia    Hypertension    Past Surgical History:  Procedure Laterality Date   APPENDECTOMY     CHOLECYSTECTOMY     EYE SURGERY Bilateral    Cataract Extraction with IOL   TOTAL KNEE ARTHROPLASTY Right 11/02/2016   Procedure: TOTAL KNEE ARTHROPLASTY;  Surgeon: Deeann Saint, MD;  Location: [facility] ORS;  Service: Orthopedics;  Laterality: Right;   TOTAL SHOULDER ARTHROPLASTY Right    Social History   Socioeconomic History   Marital status: Widowed    Spouse name: Not on file   Number of children: Not on file   Years of education: Not on file   Highest education level: Not on file  Occupational History   Not on file  Tobacco Use   Smoking status: Former    Packs/day: 0.50    Types: Cigarettes   Smokeless tobacco: Never  Substance and Sexual Activity   Alcohol use: No   Drug use: No   Sexual activity: Not Currently  Other Topics Concern   Not on file  Social History Narrative   Not on file   Social Determinants of Health   Financial Resource Strain: Not on file  Food Insecurity: Not on file  Transportation Needs: Not on file  Physical Activity: Not on file  Stress: Not on file  Social Connections: Not on file   Family History  Problem Relation Age of Onset   Rheum arthritis Mother    Hypertension Mother    Asthma Father    Hypertension Sister    No Known Allergies Prior to Admission medications   Medication Sig Start Date End Date Taking? Authorizing Provider  amLODipine (NORVASC) 10 MG tablet  Take 10 mg by mouth daily.   Yes [provider]  cyanocobalamin 1000 MCG tablet Take 1,000 mcg by mouth daily.   Yes [provider]  diclofenac (VOLTAREN) 50 MG EC tablet Take 75 mg by mouth 2 (two) times daily.   Yes [provider]  lovastatin (MEVACOR) 20 MG tablet Take 20 mg by mouth daily with supper. 07/25/16  Yes [provider]  omeprazole (PRILOSEC) 20 MG capsule Take 20 mg by mouth daily with supper. 08/26/16  Yes [provider]   DG Chest 1 View  Result Date: 12/18/2021 CLINICAL DATA:  Fall.  Preoperative study. EXAM: CHEST  1 VIEW COMPARISON:  April 04, 2018 FINDINGS: Right shoulder replacement. The heart, hila, and mediastinum are unremarkable. No pneumothorax. No nodules or masses. No focal infiltrates. IMPRESSION: No active disease. Electronically Signed   By: Gerome Sam III M.D.   On: 12/18/2021 15:59   CT HEAD WO CONTRAST ( )  Result Date: 12/18/2021 CLINICAL DATA:  Head trauma, minor (Age >= 65y) EXAM: CT HEAD WITHOUT CONTRAST TECHNIQUE: Contiguous axial images were obtained from the base of the skull through the vertex without intravenous contrast. RADIATION DOSE REDUCTION: This exam was performed according to the departmental dose-optimization program which includes automated exposure control, adjustment of the mA and/or kV according to patient size and/or  use of iterative reconstruction technique. COMPARISON:  CT dated May 18, 2012 FINDINGS: Brain: No evidence of acute infarction, hemorrhage, hydrocephalus, extra-axial collection or mass lesion/mass effect. Periventricular white matter hypodensities consistent with sequela of chronic microvascular ischemic disease. Vascular: No hyperdense vessel or unexpected calcification. Skull: Normal. Negative for fracture or focal lesion. Sinuses/Orbits: No acute finding. Other: None. IMPRESSION: No acute intracranial abnormality. Electronically Signed   By: Valentino Saxon M.D.   On:  12/18/2021 15:37   DG HIP UNILAT W OR W/O PELVIS 2-3 VIEWS RIGHT  Result Date: 12/18/2021 CLINICAL DATA:  Fall. EXAM: DG HIP (WITH OR WITHOUT PELVIS) 2-3V RIGHT COMPARISON:  CT of the abdomen and pelvis June 03, 2020 FINDINGS: There is a comminuted angulated right hip fracture through the intertrochanteric region without dislocation. There is a healed right inferior pubic ramus fracture, unchanged since the comparison CT scan. Degenerative changes in the left hip. No other abnormalities. IMPRESSION: Comminuted angulated right hip fracture through the intertrochanteric region without dislocation. No other acute abnormalities. Electronically Signed   By: Dorise Bullion III M.D.   On: 12/18/2021 15:58    Positive ROS: All other systems have been reviewed and were otherwise negative with the exception of those mentioned in the HPI and as above.  Physical Exam: General: Alert, no acute distress Cardiovascular: No pedal edema Respiratory: No cyanosis, no use of accessory musculature GI: No organomegaly, abdomen is soft and non-tender Skin: No lesions in the area of chief complaint Neurologic: Sensation intact distally Psychiatric: Patient is competent for consent with normal mood and affect Lymphatic: No axillary or cervical lymphadenopathy  MUSCULOSKELETAL: right hip short, externally rotated. Compartments soft. Good cap refill. Motor and sensory intact distally.  Assessment: Right hip intertrochanteric fracture, closed, displaced  Plan: Plan a right hip trochanteric femoral nail tomorrow.   The diagnosis, risks, benefits and alternatives to treatment are all discussed in detail with the patient. Risks include but are not limited to bleeding, infection, deep vein thrombosis, pulmonary embolism, nerve or vascular injury, non-union, repeat operation, persistent pain, weakness, stiffness and death. She understands and is eager to proceed.     Lovell Sheehan, MD    12/18/2021 7:45  PM

## 2021-12-18 NOTE — Assessment & Plan Note (Signed)
-  Continue amlodipine 

## 2021-12-19 ENCOUNTER — Inpatient Hospital Stay: Payer: Medicare Other | Admitting: Anesthesiology

## 2021-12-19 ENCOUNTER — Encounter
Admission: EM | Disposition: A | Discharge status: Skilled Nursing Facility | Payer: Self-pay | Source: Home / Self Care | Attending: Internal Medicine

## 2021-12-19 ENCOUNTER — Other Ambulatory Visit: Payer: Self-pay

## 2021-12-19 ENCOUNTER — Inpatient Hospital Stay: Payer: Medicare Other

## 2021-12-19 HISTORY — PX: INTRAMEDULLARY (IM) NAIL INTERTROCHANTERIC: SHX5875

## 2021-12-19 LAB — CBC
HCT: 25.3 % — ABNORMAL LOW (ref 36.0–46.0)
Hemoglobin: 8.2 g/dL — ABNORMAL LOW (ref 12.0–15.0)
MCH: 30.3 pg (ref 26.0–34.0)
MCHC: 32.4 g/dL (ref 30.0–36.0)
MCV: 93.4 fL (ref 80.0–100.0)
Platelets: 125 10*3/uL — ABNORMAL LOW (ref 150–400)
RBC: 2.71 MIL/uL — ABNORMAL LOW (ref 3.87–5.11)
RDW: 12.6 % (ref 11.5–15.5)
WBC: 7.3 10*3/uL (ref 4.0–10.5)
nRBC: 0 % (ref 0.0–0.2)

## 2021-12-19 LAB — BASIC METABOLIC PANEL
Anion gap: 3 — ABNORMAL LOW (ref 5–15)
BUN: 27 mg/dL — ABNORMAL HIGH (ref 8–23)
CO2: 28 mmol/L (ref 22–32)
Calcium: 8.7 mg/dL — ABNORMAL LOW (ref 8.9–10.3)
Chloride: 109 mmol/L (ref 98–111)
Creatinine, Ser: 0.68 mg/dL (ref 0.44–1.00)
GFR, Estimated: >60 mL/min (ref >60)
Glucose, Bld: 114 mg/dL — ABNORMAL HIGH (ref 70–99)
Potassium: 3.9 mmol/L (ref 3.5–5.1)
Sodium: 140 mmol/L (ref 135–145)

## 2021-12-19 LAB — SURGICAL PCR SCREEN
MRSA, PCR: NEGATIVE
Staphylococcus aureus: POSITIVE — AB

## 2021-12-19 SURGERY — FIXATION, FRACTURE, INTERTROCHANTERIC, WITH INTRAMEDULLARY ROD
Anesthesia: Regional | Site: Hip | Laterality: Right

## 2021-12-19 MED ORDER — METOCLOPRAMIDE HCL 5 MG PO TABS: 5.0000 mg | ORAL_TABLET | Freq: Three times a day (TID) | ORAL | Status: DC | PRN

## 2021-12-19 MED ORDER — SODIUM CHLORIDE 0.9 % IV SOLN: INTRAVENOUS | Status: DC | PRN

## 2021-12-19 MED ORDER — CHLORHEXIDINE GLUCONATE CLOTH 2 % EX PADS
6.0000 | MEDICATED_PAD | Freq: Every day | CUTANEOUS | Status: DC
  Administered 2021-12-20: 6 via TOPICAL

## 2021-12-19 MED ORDER — PROPOFOL 500 MG/50ML IV EMUL
INTRAVENOUS | Status: DC | PRN
  Administered 2021-12-19: 75 ug/kg/min via INTRAVENOUS

## 2021-12-19 MED ORDER — BUPIVACAINE HCL (PF) 0.5 % IJ SOLN
INTRAMUSCULAR | Status: DC | PRN
  Administered 2021-12-19: 2.2 mL

## 2021-12-19 MED ORDER — EPHEDRINE SULFATE (PRESSORS) 50 MG/ML IJ SOLN
INTRAMUSCULAR | Status: DC | PRN
  Administered 2021-12-19: 5 mg via INTRAVENOUS
  Administered 2021-12-19: 10 mg via INTRAVENOUS
  Administered 2021-12-19: 5 mg via INTRAVENOUS

## 2021-12-19 MED ORDER — PHENYLEPHRINE HCL (PRESSORS) 10 MG/ML IV SOLN
INTRAVENOUS | Status: DC | PRN
  Administered 2021-12-19 (×2): 80 ug via INTRAVENOUS
  Administered 2021-12-19 (×3): 160 ug via INTRAVENOUS

## 2021-12-19 MED ORDER — FENTANYL CITRATE (PF) 100 MCG/2ML IJ SOLN
INTRAMUSCULAR | Status: AC
  Filled 2021-12-19: qty 2

## 2021-12-19 MED ORDER — ENOXAPARIN SODIUM 40 MG/0.4ML IJ SOSY
40.0000 mg | PREFILLED_SYRINGE | INTRAMUSCULAR | Status: DC
  Administered 2021-12-20: 40 mg via SUBCUTANEOUS
  Filled 2021-12-19: qty 0.4

## 2021-12-19 MED ORDER — CEFAZOLIN SODIUM-DEXTROSE 1-4 GM/50ML-% IV SOLN
1.0000 g | Freq: Four times a day (QID) | INTRAVENOUS | Status: AC
  Administered 2021-12-19 – 2021-12-20 (×3): 1 g via INTRAVENOUS
  Filled 2021-12-19 (×3): qty 50

## 2021-12-19 MED ORDER — MUPIROCIN 2 % EX OINT
1.0000 "application " | TOPICAL_OINTMENT | Freq: Two times a day (BID) | CUTANEOUS | Status: DC
  Administered 2021-12-20 (×2): 1 via NASAL
  Filled 2021-12-19: qty 22

## 2021-12-19 MED ORDER — PROPOFOL 10 MG/ML IV BOLUS
INTRAVENOUS | Status: DC | PRN
  Administered 2021-12-19: 20 mg via INTRAVENOUS

## 2021-12-19 MED ORDER — ONDANSETRON HCL 4 MG PO TABS: 4.0000 mg | ORAL_TABLET | Freq: Four times a day (QID) | ORAL | Status: DC | PRN

## 2021-12-19 MED ORDER — FENTANYL CITRATE (PF) 100 MCG/2ML IJ SOLN
INTRAMUSCULAR | Status: DC | PRN
  Administered 2021-12-19 (×3): 25 ug via INTRAVENOUS

## 2021-12-19 MED ORDER — 0.9 % SODIUM CHLORIDE (POUR BTL) OPTIME
TOPICAL | Status: DC | PRN
  Administered 2021-12-19: 1000 mL

## 2021-12-19 MED ORDER — METOCLOPRAMIDE HCL 5 MG/ML IJ SOLN: 5.0000 mg | Freq: Three times a day (TID) | INTRAMUSCULAR | Status: DC | PRN

## 2021-12-19 MED ORDER — DOCUSATE SODIUM 100 MG PO CAPS
100.0000 mg | ORAL_CAPSULE | Freq: Two times a day (BID) | ORAL | Status: DC
  Administered 2021-12-19 – 2021-12-21 (×4): 100 mg via ORAL
  Filled 2021-12-19 (×5): qty 1

## 2021-12-19 MED ORDER — PHENYLEPHRINE 80 MCG/ML (10ML) SYRINGE FOR IV PUSH (FOR BLOOD PRESSURE SUPPORT)
PREFILLED_SYRINGE | INTRAVENOUS | Status: AC
  Filled 2021-12-19: qty 10

## 2021-12-19 MED ORDER — ONDANSETRON HCL 4 MG/2ML IJ SOLN: 4.0000 mg | Freq: Four times a day (QID) | INTRAMUSCULAR | Status: DC | PRN

## 2021-12-19 MED ORDER — BUPIVACAINE-EPINEPHRINE (PF) 0.25% -1:200000 IJ SOLN
INTRAMUSCULAR | Status: DC | PRN
  Administered 2021-12-19: 30 mL via PERINEURAL

## 2021-12-19 MED ORDER — PHENYLEPHRINE HCL-NACL 20-0.9 MG/250ML-% IV SOLN: INTRAVENOUS | Status: DC | PRN

## 2021-12-19 SURGICAL SUPPLY — 39 items
BIT DRILL CANN 16 HIP (BIT) ×1 IMPLANT
BIT DRILL CANN STP 6/9 HIP (BIT) ×1 IMPLANT
BLADE TFNA HELICA 100 (Anchor) ×1 IMPLANT
BNDG COHESIVE 4X5 TAN ST LF (GAUZE/BANDAGES/DRESSINGS) IMPLANT
BRUSH SCRUB EZ  4% CHG (MISCELLANEOUS) ×2
BRUSH SCRUB EZ 4% CHG (MISCELLANEOUS) ×2 IMPLANT
CHLORAPREP W/TINT 26 (MISCELLANEOUS) ×2 IMPLANT
DRAPE 3/4 80X56 (DRAPES) ×2 IMPLANT
DRAPE U-SHAPE 47X51 STRL (DRAPES) ×2 IMPLANT
DRSG AQUACEL AG ADV 3.5X 4 (GAUZE/BANDAGES/DRESSINGS) ×2 IMPLANT
DRSG AQUACEL AG ADV 3.5X10 (GAUZE/BANDAGES/DRESSINGS) ×2 IMPLANT
ELECT REM PT RETURN 9FT ADLT (ELECTROSURGICAL) ×2
ELECTRODE REM PT RTRN 9FT ADLT (ELECTROSURGICAL) ×1 IMPLANT
GAUZE XEROFORM 1X8 LF (GAUZE/BANDAGES/DRESSINGS) ×2 IMPLANT
GLOVE BIOGEL PI IND STRL 8.5 (GLOVE) ×1 IMPLANT
GLOVE BIOGEL PI INDICATOR 8.5 (GLOVE) ×1
GLOVE SURG ORTHO 8.5 STRL (GLOVE) ×2 IMPLANT
GOWN STRL REUS W/ TWL LRG LVL3 (GOWN DISPOSABLE) ×1 IMPLANT
GOWN STRL REUS W/ TWL XL LVL3 (GOWN DISPOSABLE) ×1 IMPLANT
GOWN STRL REUS W/TWL LRG LVL3 (GOWN DISPOSABLE) ×1
GOWN STRL REUS W/TWL XL LVL3 (GOWN DISPOSABLE) ×1
GUIDEWIRE 3.2X400 (WIRE) ×2 IMPLANT
KIT PATIENT CARE HANA TABLE (KITS) ×2 IMPLANT
KIT TURNOVER CYSTO (KITS) ×2 IMPLANT
MANIFOLD NEPTUNE II (INSTRUMENTS) ×2 IMPLANT
MAT ABSORB  FLUID 56X50 GRAY (MISCELLANEOUS) ×1
MAT ABSORB FLUID 56X50 GRAY (MISCELLANEOUS) ×1 IMPLANT
NAIL CAN TFNA 9 130D 380 RT (Nail) ×1 IMPLANT
NDL SPNL 20GX3.5 QUINCKE YW (NEEDLE) ×1 IMPLANT
NEEDLE SPNL 20GX3.5 QUINCKE YW (NEEDLE) ×2 IMPLANT
NS IRRIG 1000ML POUR BTL (IV SOLUTION) ×2 IMPLANT
PACK HIP COMPR (MISCELLANEOUS) ×2 IMPLANT
STAPLER SKIN PROX 35W (STAPLE) ×2 IMPLANT
SUT VIC AB 0 CT1 36 (SUTURE) ×2 IMPLANT
SUT VIC AB 2-0 CT1 27 (SUTURE) ×2
SUT VIC AB 2-0 CT1 TAPERPNT 27 (SUTURE) ×2 IMPLANT
SYR 30ML LL (SYRINGE) ×2 IMPLANT
TOWEL OR 17X26 4PK STRL BLUE (TOWEL DISPOSABLE) ×2 IMPLANT
WATER STERILE IRR 1000ML POUR (IV SOLUTION) ×2 IMPLANT

## 2021-12-19 NOTE — Anesthesia Procedure Notes (Signed)
Date/Time: 12/19/2021 11:50 AM Performed by: Johnna Acosta, CRNA Pre-anesthesia Checklist: Patient identified, Emergency Drugs available, Suction available, Patient being monitored and Timeout performed Patient Re-evaluated:Patient Re-evaluated prior to induction Oxygen Delivery Method: Nasal cannula Preoxygenation: Pre-oxygenation with 100% oxygen Induction Type: IV induction

## 2021-12-19 NOTE — Plan of Care (Signed)

## 2021-12-19 NOTE — Transfer of Care (Signed)
Immediate Anesthesia Transfer of Care Note  Patient: Pamela Shaffer  Procedure(s) Performed: INTRAMEDULLARY (IM) NAIL INTERTROCHANTRIC (Right: Hip)  Patient Location: PACU  Anesthesia Type:Spinal  Level of Consciousness: drowsy and patient cooperative  Airway & Oxygen Therapy: Patient Spontanous Breathing  Post-op Assessment: Report given to RN and Post -op Vital signs reviewed and stable  Post vital signs: Reviewed and stable  Last Vitals:  Vitals Value Taken Time  BP 99/40 12/19/21 1302  Temp    Pulse 77 12/19/21 1303  Resp 17 12/19/21 1303  SpO2 96 % 12/19/21 1303  Vitals shown include unvalidated device data.  Last Pain:  Vitals:   12/19/21 1042  TempSrc:   PainSc: 0-No pain         Complications: No notable events documented.

## 2021-12-19 NOTE — Op Note (Signed)
DATE OF SURGERY:  12/19/2021  TIME: 1:01 PM  PATIENT NAME:  Pamela Shaffer  AGE: 88 y.o.  PRE-OPERATIVE DIAGNOSIS:  hip fracture  POST-OPERATIVE DIAGNOSIS:  SAME  PROCEDURE:  Right INTRAMEDULLARY (IM) NAIL INTERTROCHANTRIC  SURGEON:  Lyndle Herrlich  EBL:  100 cc  COMPLICATIONS:  none apparent  OPERATIVE IMPLANTS: Synthes trochanteric femoral nail  380 mm by 9 mm  with interlocking helical blade  100 mm  PREOPERATIVE INDICATIONS:  [name] is a 88 y.o. year old who fell and suffered a hip fracture. She was brought into the ER and then admitted and optimized and then elected for surgical intervention.    The risks benefits and alternatives were discussed with the patient including but not limited to the risks of nonoperative treatment, versus surgical intervention including infection, bleeding, nerve injury, malunion, nonunion, hardware prominence, hardware failure, need for hardware removal, blood clots, cardiopulmonary complications, morbidity, mortality, among others, and they were willing to proceed.    OPERATIVE PROCEDURE:  The patient was brought to the operating room and placed in the supine position.  Spinal anesthesia was administered. She was placed on the fracture table.  Closed reduction was performed under C-arm guidance. The length of the femur was also measured using fluoroscopy. Time out was then performed after sterile prep and drape. She received preoperative antibiotics.  Incision was made proximal to the greater trochanter. A guidewire was placed in the appropriate position. Confirmation was made on AP and lateral views. The above-named nail was opened. I opened the proximal femur with a reamer. I then placed the nail by hand easily down. I did not need to ream the femur.  Once the nail was completely seated, I placed a guidepin into the femoral head into the center center position through a second incision.  I measured the length, and then reamed the lateral cortex  and up into the head. I then placed the helical blade. Slight compression was applied. Anatomic fixation achieved. Bone quality was poor.  I then secured the proximal interlock.  I then removed the instruments, and took final C-arm pictures AP and lateral the entire length of the leg. Anatomic reconstruction was achieved, and the wounds were irrigated copiously and closed with Vicryl  followed by staples and dry sterile dressing. Sponge and needle count were correct.   The patient was awakened and returned to PACU in stable and satisfactory condition. There no complications and the patient tolerated the procedure well.  She will be weightbearing as tolerated.    Lyndle Herrlich

## 2021-12-19 NOTE — Progress Notes (Signed)
  Progress Note   Patient: Pamela Shaffer GGE:[id number] DOB: 19-Apr-1926 DOA: 12/18/2021     1 DOS: the patient was seen and examined on 12/19/2021   Brief hospital course: Pamela Shaffer is a 88 year old female with history of hypertension, hyperlipidemia, GERD, who presents emergency department for chief concerns of mechanical fall and right hip pain.  Initial vitals in the emergency department show temperature of 98.2, respiration rate of 14, heart rate of 69, blood pressure 161/64.  Serum sodium is 140, potassium 3.6, chloride of 105, bicarb of 29, BUN of 26, serum creatinine of 0.74, GFR greater than 60, nonfasting blood glucose 132, WBC 6.3, hemoglobin 11.5, platelets of 157.  Imaging: Right hip with pelvis x-ray was read as comminuted angulated right hip fracture through the intertrochanteric region without dislocation.  No other acute abnormalities.  ED treatment: Fentanyl 50 mcg IV one-time dose.  Assessment and Plan: * Closed right hip fracture (HCC) - Presumed secondary to mechanical fall - Pain control: Norco 5-325 mg, 1 to 2 tablets every 6 hours.  For moderate pain, morphine 0.5 mg IV every 2 hours as needed for severe pain - Plan for operative repair today.  Awaiting post op recommendations.   GERD (gastroesophageal reflux disease) - PPI resumed  Hyperlipidemia - Pravastatin 20 mg daily resumed  Essential hypertension - Amlodipine 10 mg daily resumed        Subjective:  NAEON, reports pain R thigh.   Physical Exam: Vitals:   12/19/21 0858 12/19/21 1300 12/19/21 1302 12/19/21 1317  BP: (!) 128/50 (!) 76/58 (!) 99/40 (!) 119/47  Pulse: 79 81  76  Resp: 18 17 16 16   Temp: 97.6 F (36.4 C) 98.3 F (36.8 C)    TempSrc:      SpO2: 96% 95% 96% 92%  Weight:        Constitutional: appears age-appropriate, frail, NAD, calm, comfortable Eyes: PERRL, lids and conjunctivae normal ENMT: Mucous membranes are moist. Posterior pharynx clear of any exudate or lesions.  Age-appropriate dentition. Hearing appropriate Neck: normal, supple, no masses, no thyromegaly Respiratory: clear to auscultation bilaterally, no wheezing, no crackles. Normal respiratory effort. No accessory muscle use.  Cardiovascular: Regular rate and rhythm, no murmurs / rubs / gallops. No extremity edema. 2+ pedal pulses. No carotid bruits.  Abdomen: no tenderness, no masses palpated, no hepatosplenomegaly. Bowel sounds positive.  Musculoskeletal: no clubbing / cyanosis. No joint deformity upper and lower extremities. Good ROM of bilateral upper and left lower extremities, no contractures, no atrophy. Normal muscle tone.  Decreased range of motion of the right lower extremity.  Right hip is externally rotated and has pain with deep palpation Skin: no rashes, lesions, ulcers. No induration Neurologic: Sensation intact. Strength 5/5 in all 4.  Psychiatric: Normal judgment and insight. Alert and oriented x 3. Normal mood.    Data Reviewed:  Results are pending, will review when available.  Family Communication: unavailable  Disposition: Status is: Inpatient Remains inpatient appropriate because: hip fx pending surgical management  Planned Discharge Destination: rehab     Time spent: 25 minutes  Author: , MD 12/19/2021 1:34 PM  For on call review [email or web address].

## 2021-12-19 NOTE — Anesthesia Procedure Notes (Signed)
Spinal  Patient location during procedure: OR Reason for block: surgical anesthesia Staffing Performed: anesthesiologist  Anesthesiologist: Arita Miss, MD Preanesthetic Checklist Completed: patient identified, IV checked, site marked, risks and benefits discussed, surgical consent, monitors and equipment checked, pre-op evaluation and timeout performed Spinal Block Patient position: left lateral decubitus Prep: ChloraPrep and site prepped and draped Patient monitoring: heart rate, continuous pulse ox, blood pressure and cardiac monitor Approach: midline Location: L3-4 Injection technique: single-shot Needle Needle type: Quincke  Needle gauge: 22 G Needle length: 9 cm Assessment Sensory level: T10 Events: CSF return Additional Notes Unsuccessful attempt by CRNA with 27G whitacre, followed by successful attempt by MD with 22g quincke. Meticulous sterile technique used throughout (CHG prep, sterile gloves, sterile drape). Negative paresthesia. Negative blood return. Positive free-flowing CSF. Expiration date of kit checked and confirmed. Patient tolerated procedure well, without complications.

## 2021-12-20 ENCOUNTER — Encounter: Payer: Self-pay | Admitting: Orthopedic Surgery

## 2021-12-20 ENCOUNTER — Inpatient Hospital Stay: Payer: Medicare Other

## 2021-12-20 DIAGNOSIS — D62 Acute posthemorrhagic anemia: Secondary | ICD-10-CM

## 2021-12-20 LAB — IRON AND TIBC
Iron: 19 ug/dL — ABNORMAL LOW (ref 28–170)
Saturation Ratios: 7 % — ABNORMAL LOW (ref 10.4–31.8)
TIBC: 260 ug/dL (ref 250–450)
UIBC: 241 ug/dL

## 2021-12-20 LAB — CBC
HCT: 19.9 % — ABNORMAL LOW (ref 36.0–46.0)
Hemoglobin: 6.5 g/dL — ABNORMAL LOW (ref 12.0–15.0)
MCH: 30.2 pg (ref 26.0–34.0)
MCHC: 32.7 g/dL (ref 30.0–36.0)
MCV: 92.6 fL (ref 80.0–100.0)
Platelets: 133 10*3/uL — ABNORMAL LOW (ref 150–400)
RBC: 2.15 MIL/uL — ABNORMAL LOW (ref 3.87–5.11)
RDW: 12.8 % (ref 11.5–15.5)
WBC: 11 10*3/uL — ABNORMAL HIGH (ref 4.0–10.5)
nRBC: 0 % (ref 0.0–0.2)

## 2021-12-20 LAB — FERRITIN: Ferritin: 44 ng/mL (ref 11–307)

## 2021-12-20 LAB — RETICULOCYTES
Immature Retic Fract: 18.4 % — ABNORMAL HIGH (ref 2.3–15.9)
RBC.: 2.15 MIL/uL — ABNORMAL LOW (ref 3.87–5.11)
Retic Count, Absolute: 51.8 10*3/uL (ref 19.0–186.0)
Retic Ct Pct: 2.4 % (ref 0.4–3.1)

## 2021-12-20 LAB — PREPARE RBC (CROSSMATCH)

## 2021-12-20 LAB — FOLATE: Folate: 18.8 ng/mL (ref >5.9)

## 2021-12-20 MED ORDER — ADULT MULTIVITAMIN W/MINERALS CH
1.0000 | ORAL_TABLET | Freq: Every day | ORAL | Status: DC
  Administered 2021-12-20 – 2021-12-21 (×2): 1 via ORAL
  Filled 2021-12-20 (×2): qty 1

## 2021-12-20 MED ORDER — SODIUM CHLORIDE 0.9% IV SOLUTION: Freq: Once | INTRAVENOUS | Status: AC

## 2021-12-20 MED ORDER — ENSURE ENLIVE PO LIQD
237.0000 mL | Freq: Three times a day (TID) | ORAL | Status: DC
  Administered 2021-12-20 – 2021-12-21 (×2): 237 mL via ORAL

## 2021-12-20 NOTE — NC FL2 (Signed)
[place] MEDICAID FL2 LEVEL OF CARE SCREENING TOOL     IDENTIFICATION  Patient Name: Pamela Shaffer Birthdate: 05/15/1926 Sex: female Admission Date (Current Location): 12/18/2021  [facility] and [place] Number:  Chiropodist and Address:  [facility], [address] [place], [place] [address]      Provider Number: [id number]  Attending Physician Name and Address:  Arnetha Courser, MD  Relative Name and Phone Number:  Colin Rhein daughter [phone]    Current Level of Care: Hospital Recommended Level of Care: Skilled Nursing Facility Prior Approval Number:    Date Approved/Denied:   PASRR Number: [id number]  Discharge Plan: SNF    Current Diagnoses: Patient Active Problem List   Diagnosis Date Noted   Closed right hip fracture (HCC) 12/18/2021   Essential hypertension 12/18/2021   Hyperlipidemia 12/18/2021   GERD (gastroesophageal reflux disease) 12/18/2021   Osteoporosis 12/18/2021   Total knee replacement status 11/02/2016    Orientation RESPIRATION BLADDER Height & Weight     Self, Time, Situation, Place  Normal Continent Weight: 57.6 kg Height:     BEHAVIORAL SYMPTOMS/MOOD NEUROLOGICAL BOWEL NUTRITION STATUS      Continent Diet (see DC summary)  AMBULATORY STATUS COMMUNICATION OF NEEDS Skin   Extensive Assist Verbally Normal, Surgical wounds                       Personal Care Assistance Level of Assistance  Bathing, Dressing Bathing Assistance: Limited assistance   Dressing Assistance: Maximum assistance     Functional Limitations Info             SPECIAL CARE FACTORS FREQUENCY  PT (By licensed PT), OT (By licensed OT)     PT Frequency: 5 times per week OT Frequency: 5 times per week            Contractures Contractures Info: Not present    Additional Factors Info  Code Status, Allergies Code Status Info: Full code Allergies Info: NKDA           Current Medications (12/20/2021):  This is the  current hospital active medication list Current Facility-Administered Medications  Medication Dose Route Frequency Provider Last Rate Last Admin   0.9 %  sodium chloride infusion (Manually program via Guardrails IV Fluids)   Intravenous Once Arnetha Courser, MD       amLODipine (NORVASC) tablet 10 mg  10 mg Oral Daily Lyndle Herrlich, MD   10 mg at 12/20/21 2549   Chlorhexidine Gluconate Cloth 2 % PADS 6 each  6 each Topical Daily Lyndle Herrlich, MD   6 each at 12/20/21 0012   docusate sodium (COLACE) capsule 100 mg  100 mg Oral BID Lyndle Herrlich, MD   100 mg at 12/20/21 0816   hydrALAZINE (APRESOLINE) injection 5 mg  5 mg Intravenous Q6H PRN Lyndle Herrlich, MD       HYDROcodone-acetaminophen (NORCO/VICODIN) 5-325 MG per tablet 1-2 tablet  1-2 tablet Oral Q6H PRN Lyndle Herrlich, MD   1 tablet at 12/20/21 0817   metoCLOPramide (REGLAN) tablet 5-10 mg  5-10 mg Oral Q8H PRN Lyndle Herrlich, MD       Or   metoCLOPramide (REGLAN) injection 5-10 mg  5-10 mg Intravenous Q8H PRN Lyndle Herrlich, MD       morphine (PF) 2 MG/ML injection 0.5 mg  0.5 mg Intravenous Q2H PRN Lyndle Herrlich, MD   0.5 mg at 12/19/21 1537   mupirocin ointment (  BACTROBAN) 2 % 1 application.  1 application. Nasal BID Lyndle Herrlich, MD   1 application. at 12/20/21 1011   ondansetron (ZOFRAN) tablet 4 mg  4 mg Oral Q6H PRN Lyndle Herrlich, MD       Or   ondansetron [facility]) injection 4 mg  4 mg Intravenous Q6H PRN Lyndle Herrlich, MD       pantoprazole (PROTONIX) EC tablet 40 mg  40 mg Oral Daily Lyndle Herrlich, MD   40 mg at 12/20/21 0817   polyethylene glycol (MIRALAX / GLYCOLAX) packet 17 g  17 g Oral Daily PRN Lyndle Herrlich, MD   17 g at 12/20/21 0820   pravastatin (PRAVACHOL) tablet 20 mg  20 mg Oral q1800 Lyndle Herrlich, MD   20 mg at 12/19/21 1720   vitamin B-12 (CYANOCOBALAMIN) tablet 1,000 mcg  1,000 mcg Oral Daily Lyndle Herrlich, MD   1,000 mcg at 12/20/21 9562     Discharge Medications: Please see  discharge summary for a list of discharge medications.  Relevant Imaging Results:  Relevant Lab Results:   Additional Information SS# [id number]  Marlowe Sax, RN

## 2021-12-20 NOTE — Progress Notes (Signed)
Progress Note   Patient: Pamela Shaffer VWP:[id number] DOB: [birth year] DOA: 12/18/2021     2 DOS: the patient was seen and examined on 12/20/2021   Brief hospital course: Ms. Pamela Shaffer is a 88 year old female with history of hypertension, hyperlipidemia, GERD, who presents emergency department for chief concerns of mechanical fall and right hip pain.  Initial vitals in the emergency department show temperature of 98.2, respiration rate of 14, heart rate of 69, blood pressure 161/64.  Serum sodium is 140, potassium 3.6, chloride of 105, bicarb of 29, BUN of 26, serum creatinine of 0.74, GFR greater than 60, nonfasting blood glucose 132, WBC 6.3, hemoglobin 11.5, platelets of 157.  Imaging: Right hip with pelvis x-ray was read as comminuted angulated right hip fracture through the intertrochanteric region without dislocation.  No other acute abnormalities.  ED treatment: Fentanyl 50 mcg IV one-time dose.  6/5: S/p right intramedullary nail placement by orthopedic surgery on 12/19/2021.  Tolerated the procedure well.  No significant blood loss noted but hemoglobin dropped to 6.5 this morning, it was 11.5 on admission and 8.2 next day.  Most likely significant bleeding with hip fracture. 2 unit of PRBC ordered due to the significant drop.  Anemia panel with some iron deficiency. Blood bank is giving hard time giving 2 units, which is required due to active bleeding and a significant drop in hemoglobin in 1 day.  PT/OT are recommending SNF.   Assessment and Plan: * Closed right hip fracture (HCC) - Presumed secondary to mechanical fall - Pain control: Norco 5-325 mg, 1 to 2 tablets every 6 hours.  For moderate pain, morphine 0.5 mg IV every 2 hours as needed for severe pain - Plan for operative repair today.  Awaiting post op recommendations.   Acute blood loss anemia (ABLA) Patient developed acute blood loss anemia although no mentioning of significant blood loss during surgery.  Patient can  have a significant hematoma with hip fracture.  Hemoglobin dropped to 6.5 this morning, it was 11.5 on admission with a decreased to 8.2 yesterday.  Hemoglobin was 12.5 in December 2022. Anemia panel with some iron deficiency. CT hip with only small hematoma. -Ordered 2 unit of PRBC-patient needs at least 2 units with a drop of about 5 units within 48 hours.   -Monitor hemoglobin -DVT prophylaxis was discontinued by orthopedic surgery-we will resume once hemoglobin stabilizes. -We will start iron supplement from tomorrow  Essential hypertension - Amlodipine 10 mg daily resumed  Hyperlipidemia - Pravastatin 20 mg daily resumed  GERD (gastroesophageal reflux disease) - PPI resumed  Osteoporosis Patient had hip fracture with a ground-level fall which makes her osteoporotic. -Outpatient follow-up with PCP  Subjective: Patient continued to experience significant right hip pain.  Daughter at bedside.  Physical Exam: Vitals:   12/20/21 0604 12/20/21 0801 12/20/21 1120 12/20/21 1555  BP: (!) 151/46 (!) 151/54 (!) 122/42 (!) 133/40  Pulse: 96 97 84 91  Resp: 18 18 18 18   Temp: 98.5 F (36.9 C) 98.8 F (37.1 C) 98 F (36.7 C) 98.5 F (36.9 C)  TempSrc:      SpO2: 94% 93% 94% 93%  Weight:       General.  Pale appearing, frail elderly lady, in no acute distress. Pulmonary.  Lungs clear bilaterally, normal respiratory effort. CV.  Regular rate and rhythm, no JVD, rub or murmur. Abdomen.  Soft, nontender, nondistended, BS positive. CNS.  Alert and oriented .  No focal neurologic deficit. Extremities.  No edema, no cyanosis, pulses  intact and symmetrical. Psychiatry.  Judgment and insight appears normal.  Data Reviewed: Prior notes, labs and images reviewed  Family Communication: Discussed with daughter at bedside  Disposition: Status is: Inpatient Remains inpatient appropriate because: Severity of illness   Planned Discharge Destination: Skilled nursing facility  Time  spent: 50 minutes  This record has been created using Conservation officer, historic buildings. Errors have been sought and corrected,but may not always be located. Such creation errors do not reflect on the standard of care.  Author: Arnetha Courser, MD 12/20/2021 3:58 PM  For on call review [email or web address].

## 2021-12-20 NOTE — TOC Progression Note (Signed)
Transition of Care [facility]) - Progression Note    Patient Details  Name: Pamela Shaffer MRN: [id number] Date of Birth: Mar 24, 1926  Transition of [facility]) CM/SW Contact  Marlowe Sax, RN Phone Number: 12/20/2021, 3:44 PM  Clinical Narrative:     Debbora Dus sent, PASSR obtained, F;2 complete, will review bed offers once obtained       Expected Discharge Plan and Services                                                 Social Determinants of Health (SDOH) Interventions    Readmission Risk Interventions     View : No data to display.

## 2021-12-20 NOTE — Plan of Care (Signed)

## 2021-12-20 NOTE — Progress Notes (Signed)
Physical Therapy Treatment Patient Details Name: Pamela Shaffer MRN: [id number] DOB: 06-20-1926 Today's Date: 12/20/2021   History of Present Illness Pt is a 88 y/o F who s/p 12/19/21 internal fixation of R hip intertrochanteric fx due to a fall. PMH: HTN, hyperlipidemia, and GERD.    PT Comments    Of note, pt with hemoglobin 6.5. Per MD ok for pt to participate with PT services with low intensity activities up to and including transfer to/from chair as pt is able.  pt was pleasant and motivated to participate during the session and put forth good effort throughout. pt was able to complete supine therex without any increases in pain level. pt was able to perform rolling to R side and sidelying to sit w/ +2 assist for LE management and trunk control due to pain and overall generalized weakness. Pt performed sit to stand to RW w/ assist and required cuing for hand placement. Pt still with hesitation to bear weight through RLE during standing and ambulation. No adverse symptom response noted/reported w/ current hemoglobin level other than R hip pain. Pt will benefit from PT services in a SNF setting upon discharge to safely address deficits listed in patient problem list for decreased caregiver assistance and eventual return to PLOF.     Recommendations for follow up therapy are one component of a multi-disciplinary discharge planning process, led by the attending physician.  Recommendations may be updated based on patient status, additional functional criteria and insurance authorization.  Follow Up Recommendations  Skilled nursing-short term rehab (<3 hours/day)     Assistance Recommended at Discharge Intermittent Supervision/Assistance  Patient can return home with the following Assistance with cooking/housework;Help with stairs or ramp for entrance;A little help with walking and/or transfers;A little help with bathing/dressing/bathroom   Equipment Recommendations  Rolling walker (2 wheels)     Recommendations for Other Services       Precautions / Restrictions Precautions Precautions: Fall Restrictions Weight Bearing Restrictions: Yes RLE Weight Bearing: Weight bearing as tolerated     Mobility  Bed Mobility Overal bed mobility: Needs Assistance Bed Mobility: Rolling, Sidelying to Sit Rolling: Mod assist Sidelying to sit: +2 for physical assistance       General bed mobility comments: Pt needing +2 assist to move from sidelying to sit due to pain    Transfers Overall transfer level: Needs assistance Equipment used: Rolling walker (2 wheels) Transfers: Sit to/from Stand Sit to Stand: Max assist           General transfer comment: Pt requiring extra time and effort to perform task and verbal cuing for UE placement    Ambulation/Gait Ambulation/Gait assistance: Min guard Gait Distance (Feet): 1 Feet Assistive device: Rolling walker (2 wheels) Gait Pattern/deviations: Step-to pattern, Decreased step length - right, Decreased step length - left, Decreased stance time - right       General Gait Details: Pt able to shuffle to head of bed with minimal clearance from both LE.   Stairs             Wheelchair Mobility    Modified Rankin (Stroke Patients Only)       Balance Overall balance assessment: Needs assistance Sitting-balance support: Bilateral upper extremity supported, Feet supported Sitting balance-Leahy Scale: Fair     Standing balance support: Reliant on assistive device for balance Standing balance-Leahy Scale: Fair  Cognition Arousal/Alertness: Awake/alert Behavior During Therapy: WFL for tasks assessed/performed Overall Cognitive Status: Within Functional Limits for tasks assessed                                          Exercises Total Joint Exercises Ankle Circles/Pumps: AROM, Strengthening, Both, 10 reps Quad Sets: AROM, Strengthening, Both, 10 reps Gluteal  Sets: Strengthening, Both, 10 reps Hip ABduction/ADduction: AROM, AAROM, Right, Left, 10 reps Knee Flexion: Strengthening, Both, 10 reps, AROM    General Comments        Pertinent Vitals/Pain Pain Assessment Pain Assessment: 0-10 Pain Score: 8  Pain Location: R hip Pain Descriptors / Indicators: Discomfort, Grimacing Pain Intervention(s): Monitored during session, Ice applied, Repositioned, Premedicated before session    Home Living                          Prior Function            PT Goals (current goals can now be found in the care plan section) Progress towards PT goals: Progressing toward goals    Frequency    BID      PT Plan Current plan remains appropriate    Co-evaluation              AM-PAC PT "6 Clicks" Mobility   Outcome Measure  Help needed turning from your back to your side while in a flat bed without using bedrails?: A Lot Help needed moving from lying on your back to sitting on the side of a flat bed without using bedrails?: A Lot Help needed moving to and from a bed to a chair (including a wheelchair)?: A Lot Help needed standing up from a chair using your arms (e.g., wheelchair or bedside chair)?: A Lot Help needed to walk in hospital room?: A Lot Help needed climbing 3-5 steps with a railing? : A Lot 6 Click Score: 12    End of Session Equipment Utilized During Treatment: Gait belt Activity Tolerance: Patient limited by pain Patient left: with call bell/phone within reach;with bed alarm set Nurse Communication: Mobility status PT Visit Diagnosis: Difficulty in walking, not elsewhere classified (R26.2);Muscle weakness (generalized) (M62.81)     Time: 5053-9767 PT Time Calculation (min) (ACUTE ONLY): 26 min  Charges:                           Marica Otter, SPT  12/20/2021, 4:35 PM

## 2021-12-20 NOTE — Progress Notes (Signed)
Initial Nutrition Assessment  DOCUMENTATION CODES:   Non-severe (moderate) malnutrition in context of social or environmental circumstances  INTERVENTION:   -Ensure Enlive po TID, each supplement provides 350 kcal and 20 grams of protein -MVI with minerals daily  NUTRITION DIAGNOSIS:   Moderate Malnutrition related to social / environmental circumstances as evidenced by mild fat depletion, moderate fat depletion, mild muscle depletion, moderate muscle depletion.  GOAL:   Patient will meet greater than or equal to 90% of their needs  MONITOR:   PO intake, Supplement acceptance  REASON FOR ASSESSMENT:   Consult Assessment of nutrition requirement/status  ASSESSMENT:   Pt with history of hypertension, hyperlipidemia, GERD, who presents for chief concerns of mechanical fall and right hip pain.  Pt admitted with closed rt hip fracture.   6/4- s/p rt IM nail intertochanteric   Reviewed I/O's: -500 ml x 24 hours and -1.1 L since admission  UOP: 1.1 L x 24 hours  Spoke with pt and daughter at bedside. Pt daughter reports pt chronically has a poor appetite. Pt typically grazes all day on snacks such as kale chips, muffins, and fruit. Daughter has provided pt snacks to eat. She also consumes coffee, water, and juice at home. Noted meal completions 20%.    Pt reports her UBW is around 160#, but weighed that "years ago". Pt daughter confirms prolonged, progressive wt loss. Wt has been stable over the past 2 years.   Discussed importance of good meal and supplement intake to promote healing. Pt amenable to Ensure supplements.   Medications reviewed and include colace and vitamin B-12.   Labs reviewed.   NUTRITION - FOCUSED PHYSICAL EXAM:  Flowsheet Row Most Recent Value  Orbital Region Mild depletion  Upper Arm Region Moderate depletion  Thoracic and Lumbar Region Mild depletion  Buccal Region Moderate depletion  Temple Region Mild depletion  Clavicle Bone Region Moderate  depletion  Clavicle and Acromion Bone Region Moderate depletion  Scapular Bone Region Moderate depletion  Dorsal Hand Mild depletion  Patellar Region No depletion  Anterior Thigh Region No depletion  Posterior Calf Region No depletion  Edema (RD Assessment) Mild  Hair Reviewed  Eyes Reviewed  Mouth Reviewed  Skin Reviewed  Nails Reviewed       Diet Order:   Diet Order             Diet regular Room service appropriate? Yes; Fluid consistency: Thin  Diet effective now                   EDUCATION NEEDS:   Education needs have been addressed  Skin:  Skin Assessment: Skin Integrity Issues: Skin Integrity Issues:: Incisions Incisions: closed rt hip  Last BM:  12/18/21  Height:   Ht Readings from Last 1 Encounters:  06/03/20 5' (1.524 m)    Weight:   Wt Readings from Last 1 Encounters:  12/18/21 57.6 kg    Ideal Body Weight:  45.5 kg  BMI:  Body mass index is 24.8 kg/m.  Estimated Nutritional Needs:   Kcal:  1550-1750  Protein:  75-90 grams  Fluid:  > 1.5 L    Levada Schilling, RD, LDN, CDCES Registered Dietitian II Certified Diabetes Care and Education Specialist Please refer to [facility] for RD and/or RD on-call/weekend/after hours pager

## 2021-12-20 NOTE — Progress Notes (Signed)
Subjective:  Patient reports pain as mild.  No other complaints.  Objective:   VITALS:   Vitals:   12/20/21 1120 12/20/21 1555 12/20/21 1706 12/20/21 1726  BP: (!) 122/42 (!) 133/40 (!) 139/44 121/78  Pulse: 84 91 93 91  Resp: 18 18 18 18   Temp: 98 F (36.7 C) 98.5 F (36.9 C) 98.8 F (37.1 C) 98.7 F (37.1 C)  TempSrc:      SpO2: 94% 93% 96% 96%  Weight:        PHYSICAL EXAM:  Sensation intact distally Dorsiflexion/Plantar flexion intact Incision: dressing C/D/I No cellulitis present Compartment soft  LABS  Results for orders placed or performed during the hospital encounter of 12/18/21 (from the past 24 hour(s))  CBC     Status: Abnormal   Collection Time: 12/20/21  9:58 AM  Result Value Ref Range   WBC 11.0 (H) 4.0 - 10.5 K/uL   RBC 2.15 (L) 3.87 - 5.11 MIL/uL   Hemoglobin 6.5 (L) 12.0 - 15.0 g/dL   HCT 02/19/22 (L) 23.7 - 62.8 %   MCV 92.6 80.0 - 100.0 fL   MCH 30.2 26.0 - 34.0 pg   MCHC 32.7 30.0 - 36.0 g/dL   RDW 31.5 17.6 - 16.0 %   Platelets 133 (L) 150 - 400 K/uL   nRBC 0.0 0.0 - 0.2 %  Folate     Status: None   Collection Time: 12/20/21  9:58 AM  Result Value Ref Range   Folate 18.8 >5.9 ng/mL  Iron and TIBC     Status: Abnormal   Collection Time: 12/20/21  9:58 AM  Result Value Ref Range   Iron 19 (L) 28 - 170 ug/dL   TIBC 02/19/22 106 - 269 ug/dL   Saturation Ratios 7 (L) 10.4 - 31.8 %   UIBC 241 ug/dL  Ferritin     Status: None   Collection Time: 12/20/21  9:58 AM  Result Value Ref Range   Ferritin 44 11 - 307 ng/mL  Reticulocytes     Status: Abnormal   Collection Time: 12/20/21  9:58 AM  Result Value Ref Range   Retic Ct Pct 2.4 0.4 - 3.1 %   RBC. 2.15 (L) 3.87 - 5.11 MIL/uL   Retic Count, Absolute 51.8 19.0 - 186.0 K/uL   Immature Retic Fract 18.4 (H) 2.3 - 15.9 %  Prepare RBC (crossmatch)     Status: None   Collection Time: 12/20/21 11:55 AM  Result Value Ref Range   Order Confirmation      ORDER PROCESSED BY BLOOD BANK Performed at  [facility], [address], [place], [place]     CT HIP RIGHT WO CONTRAST  Result Date: 12/20/2021 CLINICAL DATA:  Hip trauma, drop in hemoglobin. EXAM: CT OF THE RIGHT HIP WITHOUT CONTRAST TECHNIQUE: Multidetector CT imaging of the right hip was performed according to the standard protocol. Multiplanar CT image reconstructions were also generated. RADIATION DOSE REDUCTION: This exam was performed according to the departmental dose-optimization program which includes automated exposure control, adjustment of the mA and/or kV according to patient size and/or use of iterative reconstruction technique. COMPARISON:  None Available. FINDINGS: Bones/Joint/Cartilage Comminuted right intertrochanteric fracture transfixed with a intramedullary nail and interlocking femoral neck screw. 2 cm of medial displacement of the lesser trochanter. Mild osteoarthritis of the right hip. 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 Old healed right inferior pubic ramus fracture. Degenerative changes of the pubic symphysis. Normal alignment. No joint effusion. Ligaments Ligaments  are suboptimally evaluated by CT. Muscles and Tendons Soft tissue emphysema in the right gluteus medius muscle and vastus lateralis muscle secondary to recent instrumentation. No muscle atrophy. Soft tissue Soft tissue edema and emphysema in the subcutaneous fat overlying the right greater trochanter consistent with recent right intertrochanteric fracture ORIF. Small 11 mm hematoma in the subcutaneous fat. No soft tissue mass. IMPRESSION: 1. Comminuted right intertrochanteric fracture transfixed with a intramedullary nail and interlocking femoral neck screw. 2. No large hematoma in the subcutaneous fat or within the muscles to explain the patient's drop in hemoglobin. Electronically Signed   By: Elige Ko M.D.   On: 12/20/2021 13:31   DG C-Arm 1-60 Min-No Report  Result Date: 12/19/2021 Fluoroscopy was utilized by the requesting  physician.  No radiographic interpretation.   DG HIP UNILAT WITH PELVIS 2-3 VIEWS RIGHT  Result Date: 12/19/2021 CLINICAL DATA:  Intertrochanteric right hip fracture. EXAM: DG HIP (WITH OR WITHOUT PELVIS) 2-3V RIGHT COMPARISON:  None Available. FINDINGS: Multiple intraoperative spot radiographs show placement of a intramedullary nail and rod transfixing an intertrochanteric fracture in near anatomic alignment. IMPRESSION: Internal fixation of intertrochanteric fracture in near anatomic alignment. Electronically Signed   By: Danae Orleans M.D.   On: 12/19/2021 16:00    Assessment/Plan: 1 Day Post-Op   Principal Problem:   Closed right hip fracture (HCC) Active Problems:   Essential hypertension   Hyperlipidemia   GERD (gastroesophageal reflux disease)   Osteoporosis   Acute blood loss anemia (ABLA)   Up with therapy Discharge to SNF when medically stable Hold DVT prophylaxis until H/H stabilize, acute blood loss anemia and hemodilution, no evidence of active bleeding WBAT RTC 2 weeks for staple removal   Lyndle Herrlich , MD 12/20/2021, 5:30 PM

## 2021-12-20 NOTE — Anesthesia Postprocedure Evaluation (Signed)
Anesthesia Post Note  Patient: Pamela Shaffer  Procedure(s) Performed: INTRAMEDULLARY (IM) NAIL INTERTROCHANTRIC (Right: Hip)  Patient location during evaluation: PACU Anesthesia Type: Spinal Level of consciousness: oriented and awake and alert Pain management: pain level controlled Vital Signs Assessment: post-procedure vital signs reviewed and stable Respiratory status: spontaneous breathing, respiratory function stable and patient connected to nasal cannula oxygen Cardiovascular status: blood pressure returned to baseline and stable Postop Assessment: no headache, no backache and no apparent nausea or vomiting Anesthetic complications: no   No notable events documented.   Last Vitals:  Vitals:   12/20/21 0801 12/20/21 1120  BP: (!) 151/54 (!) 122/42  Pulse: 97 84  Resp: 18 18  Temp: 37.1 C 36.7 C  SpO2: 93% 94%    Last Pain:  Vitals:   12/20/21 1400  TempSrc:   PainSc: 7                  Corinda Gubler

## 2021-12-20 NOTE — Assessment & Plan Note (Addendum)
Patient developed acute blood loss anemia although no mentioning of significant blood loss during surgery.  Patient can have a significant hematoma with hip fracture.  Hemoglobin dropped to 6.5 this morning, it was 11.5 on admission with a decreased to 8.2 yesterday.  Hemoglobin was 12.5 in December 2022. Anemia panel with some iron deficiency. CT hip with only small hematoma. -Ordered 2 unit of PRBC-patient needs at least 2 units with a drop of about 5 units within 48 hours.   -Monitor hemoglobin -DVT prophylaxis was discontinued by orthopedic surgery-we will resume once hemoglobin stabilizes. -We will start iron supplement from tomorrow

## 2021-12-20 NOTE — Assessment & Plan Note (Signed)
Patient had hip fracture with a ground-level fall which makes her osteoporotic. -Outpatient follow-up with PCP

## 2021-12-20 NOTE — Evaluation (Signed)
Occupational Therapy Evaluation Patient Details Name: Pamela Shaffer MRN: [id number] DOB: Jul 23, 1925 Today's Date: 12/20/2021   History of Present Illness Pt is a 88 y/o F who s/p 12/19/21 internal fixation of R hip intertrochanteric fx due to a fall. PMH: HTN, hyperlipidemia, and GERD.   Clinical Impression   Pt was seen for OT evaluation this date. Prior to hospital admission, pt used a 4WW/SPC for mobility and independent with all ADLs. Pt lives in alone in a condo with a level entry. Pt presents to acute OT demonstrating impaired ADL performance and functional mobility 2/2 decreased activity tolerance and s/p internal fixation of R hip intertrochanteric fx - functional strength/ROM/balance deficits. Pt currently requires MAX A for donning and doffing socks and MAX A, no AD for sit<>stand. Pt reported 6/10 pain for R hip throughout treatment session. Unable to tolerate standing grooming tasks, anticipate MIN A sitting. Pt would benefit from skilled OT to address noted impairments and functional limitations (see below for any additional details). Upon hospital discharge, recommend STR to maximize pt safety and return to PLOF.      Recommendations for follow up therapy are one component of a multi-disciplinary discharge planning process, led by the attending physician.  Recommendations may be updated based on patient status, additional functional criteria and insurance authorization.   Follow Up Recommendations  Skilled nursing-short term rehab (<3 hours/day)    Assistance Recommended at Discharge Frequent or constant Supervision/Assistance  Patient can return home with the following A lot of help with walking and/or transfers;A lot of help with bathing/dressing/bathroom;Assistance with cooking/housework;Assist for transportation    Functional Status Assessment  Patient has had a recent decline in their functional status and demonstrates the ability to make significant improvements in function in a  reasonable and predictable amount of time.  Equipment Recommendations  None recommended by OT    Recommendations for Other Services       Precautions / Restrictions Precautions Precautions: Fall Restrictions Weight Bearing Restrictions: Yes RLE Weight Bearing: Weight bearing as tolerated Other Position/Activity Restrictions: WBAT      Mobility Bed Mobility               General bed mobility comments: Pt in recliner at start/end of session    Transfers Overall transfer level: Needs assistance Equipment used: None Transfers: Sit to/from Stand Sit to Stand: Max assist                  Balance Overall balance assessment: Needs assistance Sitting-balance support: Bilateral upper extremity supported, Feet unsupported Sitting balance-Leahy Scale: Fair     Standing balance support: Bilateral upper extremity supported Standing balance-Leahy Scale: Fair                             ADL either performed or assessed with clinical judgement   ADL Overall ADL's : Needs assistance/impaired                                       General ADL Comments: MAX A for donning and doffing socks. Unable to tolerate standing grooming tasks, anticipate MIN A sitting.      Pertinent Vitals/Pain Pain Assessment Pain Assessment: 0-10 Pain Score: 6  Pain Location: R hip Pain Descriptors / Indicators: Discomfort, Grimacing Pain Intervention(s): Ice applied, Limited activity within patient's tolerance, Repositioned, Monitored during session     Hand  Dominance     Extremity/Trunk Assessment Upper Extremity Assessment Upper Extremity Assessment: Generalized weakness   Lower Extremity Assessment Lower Extremity Assessment: Defer to PT evaluation       Communication Communication Communication: No difficulties   Cognition Arousal/Alertness: Awake/alert Behavior During Therapy: WFL for tasks assessed/performed Overall Cognitive Status: Within  Functional Limits for tasks assessed                                                  Home Living Family/patient expects to be discharged to:: Private residence Living Arrangements: Alone Available Help at Discharge: Family;Available PRN/intermittently;Other (Comment) (daughter and granddaughter) Type of Home: Other(Comment) (Pt reported living in a condo) Home Access: Level entry     Home Layout: One level     Bathroom Shower/Tub: Producer, television/film/video: Handicapped height Bathroom Accessibility: Yes   Home Equipment: Rollator (4 wheels);Cane - single point;BSC/3in1          Prior Functioning/Environment Prior Level of Function : Independent/Modified Independent             Mobility Comments: PLOF independent and community ambulatory using rollator with help from family for driving. ADLs Comments: Independent with all ADLs        OT Problem List: Decreased strength;Decreased range of motion;Decreased activity tolerance;Impaired balance (sitting and/or standing)      OT Treatment/Interventions: Self-care/ADL training;Therapeutic exercise;Energy conservation;DME and/or AE instruction;Therapeutic activities;Patient/family education;Balance training    OT Goals(Current goals can be found in the care plan section) Acute Rehab OT Goals Patient Stated Goal: To improve pain OT Goal Formulation: With patient Time For Goal Achievement: 01/03/22 Potential to Achieve Goals: Good ADL Goals Pt Will Perform Grooming: standing;with min assist (with LRAD) Pt Will Perform Lower Body Dressing: with min assist;sit to/from stand (with LRAD) Pt Will Transfer to Toilet: ambulating;bedside commode;with min assist (with LRAD)  OT Frequency: Min 2X/week       AM-PAC OT "6 Clicks" Daily Activity     Outcome Measure Help from another person eating meals?: None Help from another person taking care of personal grooming?: A Little Help from another person  toileting, which includes using toliet, bedpan, or urinal?: A Lot Help from another person bathing (including washing, rinsing, drying)?: A Lot Help from another person to put on and taking off regular upper body clothing?: A Little Help from another person to put on and taking off regular lower body clothing?: A Lot 6 Click Score: 16   End of Session Equipment Utilized During Treatment: Gait belt  Activity Tolerance: Patient tolerated treatment well;Patient limited by pain Patient left: in chair;with call bell/phone within reach;with chair alarm set;with SCD's reapplied  OT Visit Diagnosis: Muscle weakness (generalized) (M62.81)                Time: 5176-1607 OT Time Calculation (min): 31 min Charges:  OT General Charges $OT Visit: 1 Visit OT Evaluation $OT Eval Moderate Complexity: 1 Mod OT Treatments $Therapeutic Activity: 8-22 mins  [employer], OTDS  [employer] 12/20/2021, 1:08 PM

## 2021-12-20 NOTE — Evaluation (Signed)
Physical Therapy Evaluation Patient Details Name: Turner Kunzman MRN: [id number] DOB: Feb 06, 1926 Today's Date: 12/20/2021  History of Present Illness  Pt is a 88 y/o F who s/p 12/19/21 internal fixation of R hip intertrochanteric fx due to a fall. PMH: HTN, hyperlipidemia, and GERD.  Clinical Impression  Pt was pleasant and motivated to participate during the session and put forth good effort throughout. Pt was extremely limited by pain and requiring extra time and effort with tasks. Pt required max A to perform sit to stand and required verbal cuing for UE placement to assist with stand. Pt was only able to ambulate 21ft CGA w/ RW and required verbal cuing for sequencing of gait pattern and, again, was very limited by pain with limited weightbearing through the RLE. Pt's current living situation is alone at home with assistance available intermittently. Pt will benefit from PT services in a SNF setting upon discharge to safely address deficits listed in patient problem list for decreased caregiver assistance and eventual return to PLOF.        Recommendations for follow up therapy are one component of a multi-disciplinary discharge planning process, led by the attending physician.  Recommendations may be updated based on patient status, additional functional criteria and insurance authorization.  Follow Up Recommendations Skilled nursing-short term rehab (<3 hours/day)    Assistance Recommended at Discharge Intermittent Supervision/Assistance  Patient can return home with the following  Assistance with cooking/housework;Help with stairs or ramp for entrance;A little help with walking and/or transfers;A little help with bathing/dressing/bathroom    Equipment Recommendations Rolling walker (2 wheels)  Recommendations for Other Services       Functional Status Assessment Patient has had a recent decline in their functional status and demonstrates the ability to make significant improvements in  function in a reasonable and predictable amount of time.     Precautions / Restrictions Precautions Precautions: Fall Restrictions Weight Bearing Restrictions: Yes RLE Weight Bearing: Weight bearing as tolerated Other Position/Activity Restrictions: WBAT      Mobility  Bed Mobility               General bed mobility comments: Pt in recliner at start/end of session    Transfers Overall transfer level: Needs assistance Equipment used: Rolling walker (2 wheels) Transfers: Sit to/from Stand Sit to Stand: Max assist           General transfer comment: Pt requiring extra time and effort to perform task and verbal cuing for UE placement and trunk flexion.     Ambulation/Gait Ambulation/Gait assistance: Min guard Gait Distance (Feet): 2 Feet Assistive device: Rolling walker (2 wheels) Gait Pattern/deviations: Step-to pattern, Decreased step length - right, Decreased step length - left, Decreased stance time - right       General Gait Details: pt required extra tim/ effort and cuing for sequencing. pt was very limited by pain.  Stairs            Wheelchair Mobility    Modified Rankin (Stroke Patients Only)       Balance Overall balance assessment: Needs assistance Sitting-balance support: Bilateral upper extremity supported Sitting balance-Leahy Scale: Fair     Standing balance support: Reliant on assistive device for balance Standing balance-Leahy Scale: Fair                               Pertinent Vitals/Pain Pain Assessment Pain Assessment: 0-10 Pain Score: 6 Location: R hip  Pain Descriptors /  Indicators: Aching, Discomfort Pain Intervention(s): Limited activity within patient's tolerance, Monitored during session, Repositioned (Pt denies wanting to use pain meds)    Home Living Family/patient expects to be discharged to:: Private residence Living Arrangements: Alone Available Help at Discharge: Family;Available  PRN/intermittently;Other (Comment) (daughter and granddaughter) Type of Home: Other(Comment) (Pt reported living in a condo) Home Access: Level entry       Home Layout: One level Home Equipment: Rollator (4 wheels);Cane - single point;BSC/3in1      Prior Function Prior Level of Function : Independent/Modified Independent             Mobility Comments: PLOF independent and community ambulatory using rollator with help from family for driving. ADLs Comments: Independent with all ADLs     Hand Dominance        Extremity/Trunk Assessment   Upper Extremity Assessment Upper Extremity Assessment: Generalized weakness    Lower Extremity Assessment Lower Extremity Assessment: Defer to PT evaluation       Communication   Communication: No difficulties  Cognition Arousal/Alertness: Awake/alert Behavior During Therapy: WFL for tasks assessed/performed Overall Cognitive Status: Within Functional Limits for tasks assessed                                          General Comments      Exercises Total Joint Exercises Ankle Circles/Pumps: AAROM, Strengthening, Both, 10 reps Quad Sets: AAROM, Strengthening, Both, 10 reps Gluteal Sets: Strengthening, Both, 10 reps Knee Flexion: AAROM, Strengthening, Both, 10 reps   Assessment/Plan    PT Assessment Patient needs continued PT services  PT Problem List Decreased strength;Decreased activity tolerance;Decreased balance;Decreased mobility;Decreased safety awareness       PT Treatment Interventions Gait training;Stair training;Functional mobility training;Therapeutic activities;Patient/family education;Balance training;Therapeutic exercise;DME instruction    PT Goals (Current goals can be found in the Care Plan section)  Acute Rehab PT Goals Patient Stated Goal: Pt would like to get back to walking PT Goal Formulation: With patient Time For Goal Achievement: 01/02/22 Potential to Achieve Goals: Fair     Frequency BID     Co-evaluation               AM-PAC PT "6 Clicks" Mobility  Outcome Measure Help needed turning from your back to your side while in a flat bed without using bedrails?: A Lot Help needed moving from lying on your back to sitting on the side of a flat bed without using bedrails?: A Lot Help needed moving to and from a bed to a chair (including a wheelchair)?: A Lot Help needed standing up from a chair using your arms (e.g., wheelchair or bedside chair)?: A Lot Help needed to walk in hospital room?: A Lot Help needed climbing 3-5 steps with a railing? : A Lot 6 Click Score: 12    End of Session Equipment Utilized During Treatment: Gait belt Activity Tolerance: Patient limited by pain Patient left: in chair;with call bell/phone within reach;with chair alarm set Nurse Communication: Mobility status PT Visit Diagnosis: Difficulty in walking, not elsewhere classified (R26.2);Muscle weakness (generalized) (M62.81)    Time: 6387-5643 PT Time Calculation (min) (ACUTE ONLY): 25 min   Charges:             Marica Otter, SPT  12/20/2021, 1:45 PM

## 2021-12-21 DIAGNOSIS — E440 Moderate protein-calorie malnutrition: Secondary | ICD-10-CM | POA: Insufficient documentation

## 2021-12-21 LAB — CBC
HCT: 25.2 % — ABNORMAL LOW (ref 36.0–46.0)
Hemoglobin: 8.8 g/dL — ABNORMAL LOW (ref 12.0–15.0)
MCH: 30.6 pg (ref 26.0–34.0)
MCHC: 34.9 g/dL (ref 30.0–36.0)
MCV: 87.5 fL (ref 80.0–100.0)
Platelets: 103 10*3/uL — ABNORMAL LOW (ref 150–400)
RBC: 2.88 MIL/uL — ABNORMAL LOW (ref 3.87–5.11)
RDW: 14.7 % (ref 11.5–15.5)
WBC: 9.5 10*3/uL (ref 4.0–10.5)
nRBC: 0 % (ref 0.0–0.2)

## 2021-12-21 LAB — BPAM RBC
Blood Product Expiration Date: 202306072359
Blood Product Expiration Date: 202306082359
ISSUE DATE / TIME: 202306051705
ISSUE DATE / TIME: 202306052144
Unit Type and Rh: 600
Unit Type and Rh: 6200

## 2021-12-21 LAB — TYPE AND SCREEN
ABO/RH(D): A POS
Antibody Screen: NEGATIVE
Unit division: 0
Unit division: 0

## 2021-12-21 LAB — BASIC METABOLIC PANEL
Anion gap: 6 (ref 5–15)
BUN: 24 mg/dL — ABNORMAL HIGH (ref 8–23)
CO2: 28 mmol/L (ref 22–32)
Calcium: 8.4 mg/dL — ABNORMAL LOW (ref 8.9–10.3)
Chloride: 103 mmol/L (ref 98–111)
Creatinine, Ser: 0.66 mg/dL (ref 0.44–1.00)
GFR, Estimated: >60 mL/min (ref >60)
Glucose, Bld: 113 mg/dL — ABNORMAL HIGH (ref 70–99)
Potassium: 3.8 mmol/L (ref 3.5–5.1)
Sodium: 137 mmol/L (ref 135–145)

## 2021-12-21 LAB — VITAMIN B12: Vitamin B-12: 909 pg/mL (ref 180–914)

## 2021-12-21 MED ORDER — POLYETHYLENE GLYCOL 3350 17 G PO PACK: 17.0000 g | PACK | Freq: Every day | ORAL | 0 refills | Status: AC | PRN

## 2021-12-21 MED ORDER — ENSURE ENLIVE PO LIQD: 237.0000 mL | Freq: Three times a day (TID) | ORAL | 12 refills | Status: AC

## 2021-12-21 MED ORDER — ADULT MULTIVITAMIN W/MINERALS CH: 1.0000 | ORAL_TABLET | Freq: Every day | ORAL | Status: AC

## 2021-12-21 MED ORDER — HYDROCODONE-ACETAMINOPHEN 5-325 MG PO TABS: 1.0000 | ORAL_TABLET | Freq: Four times a day (QID) | ORAL | 0 refills | Status: AC | PRN

## 2021-12-21 MED ORDER — DOCUSATE SODIUM 100 MG PO CAPS: 100.0000 mg | ORAL_CAPSULE | Freq: Two times a day (BID) | ORAL | 0 refills | Status: AC

## 2021-12-21 MED ORDER — FERROUS SULFATE 325 (65 FE) MG PO TABS: 325.0000 mg | ORAL_TABLET | Freq: Two times a day (BID) | ORAL | Status: DC

## 2021-12-21 MED ORDER — FERROUS SULFATE 325 (65 FE) MG PO TABS: 325.0000 mg | ORAL_TABLET | Freq: Two times a day (BID) | ORAL | 3 refills | Status: AC

## 2021-12-21 MED ORDER — ASPIRIN 81 MG PO TBEC: 81.0000 mg | DELAYED_RELEASE_TABLET | Freq: Every day | ORAL | 12 refills | Status: AC

## 2021-12-21 NOTE — Progress Notes (Signed)
Report called to [facility], Laurelyn Sickle took report.  IV taken out.  EMS here to pick up patient, AVS packet sent with EMS.

## 2021-12-21 NOTE — Progress Notes (Signed)
Physical Therapy Treatment Patient Details Name: Pamela Shaffer MRN: [id number] DOB: [birth year] Today's Date: 12/21/2021   History of Present Illness Pt is a 88 y/o F who s/p 12/19/21 internal fixation of R hip intertrochanteric fx due to a fall. PMH: HTN, hyperlipidemia, and GERD.    PT Comments    Pt was pleasant and motivated to participate during the session and put forth good effort throughout. Pt reported no R hip at the start/ end of session. Pt required +2 assist to go from supine to EOB and need assist for trunk control and leg management, specifically advancing the RLE minA. Pt performed sit to stand to RW and required maxA to initiate stand and needed verbal cuing for hand placement. During transfer to recliner pt was able to accept more weight through RLE but still only had minimal clearance when shuffling using RW. Pt required extra time and effort and verbal cuing for sequencing during the transfer. Pt will benefit from PT services in a SNF setting upon discharge to safely address deficits listed in patient problem list for decreased caregiver assistance and eventual return to PLOF.   Recommendations for follow up therapy are one component of a multi-disciplinary discharge planning process, led by the attending physician.  Recommendations may be updated based on patient status, additional functional criteria and insurance authorization.  Follow Up Recommendations  Skilled nursing-short term rehab (<3 hours/day)     Assistance Recommended at Discharge Intermittent Supervision/Assistance  Patient can return home with the following Assistance with cooking/housework;Help with stairs or ramp for entrance;A little help with walking and/or transfers;A little help with bathing/dressing/bathroom   Equipment Recommendations  Rolling walker (2 wheels)    Recommendations for Other Services       Precautions / Restrictions Precautions Precautions: Fall Restrictions Weight Bearing  Restrictions: Yes RLE Weight Bearing: Weight bearing as tolerated     Mobility  Bed Mobility Overal bed mobility: Needs Assistance Bed Mobility: Supine to Sit Rolling: Mod assist Sidelying to sit: +2 for physical assistance       General bed mobility comments: Pt required +2 assist for leg management and trunk control    Transfers Overall transfer level: Needs assistance Equipment used: Rolling walker (2 wheels) Transfers: Sit to/from Stand Sit to Stand: Max assist           General transfer comment: Pt requiring extra time and effort to perform task and verbal cuing for UE placement    Ambulation/Gait Ambulation/Gait assistance: Min guard Gait Distance (Feet): 3 Feet Assistive device: Rolling walker (2 wheels) Gait Pattern/deviations: Step-to pattern, Decreased step length - right, Decreased step length - left, Decreased stance time - right       General Gait Details: Pt able to shuffle to recliner with minimal clearance from both LE.   Stairs             Wheelchair Mobility    Modified Rankin (Stroke Patients Only)       Balance Overall balance assessment: Needs assistance Sitting-balance support: Bilateral upper extremity supported, Feet supported Sitting balance-Leahy Scale: Fair     Standing balance support: Reliant on assistive device for balance Standing balance-Leahy Scale: Fair                              Cognition Arousal/Alertness: Awake/alert Behavior During Therapy: WFL for tasks assessed/performed Overall Cognitive Status: Within Functional Limits for tasks assessed  Exercises Total Joint Exercises Ankle Circles/Pumps: AROM, Strengthening, Both, 10 reps Quad Sets: AROM, Strengthening, Both, 10 reps Gluteal Sets: Strengthening, Both, 10 reps    General Comments        Pertinent Vitals/Pain Pain Assessment Pain Assessment: 0-10 Pain Score: 0-No  pain Pain Location: R hip Pain Intervention(s): Monitored during session, Premedicated before session, Repositioned    Home Living                          Prior Function            PT Goals (current goals can now be found in the care plan section) Progress towards PT goals: Progressing toward goals    Frequency    BID      PT Plan Current plan remains appropriate    Co-evaluation              AM-PAC PT "6 Clicks" Mobility   Outcome Measure  Help needed turning from your back to your side while in a flat bed without using bedrails?: A Lot Help needed moving from lying on your back to sitting on the side of a flat bed without using bedrails?: A Lot Help needed moving to and from a bed to a chair (including a wheelchair)?: A Lot Help needed standing up from a chair using your arms (e.g., wheelchair or bedside chair)?: A Lot Help needed to walk in hospital room?: A Lot Help needed climbing 3-5 steps with a railing? : A Lot 6 Click Score: 12    End of Session Equipment Utilized During Treatment: Gait belt Activity Tolerance: Patient limited by pain Patient left: in chair;with call bell/phone within reach;with chair alarm set;with family/visitor present Nurse Communication: Mobility status PT Visit Diagnosis: Difficulty in walking, not elsewhere classified (R26.2);Muscle weakness (generalized) (M62.81)     Time: [id number] PT Time Calculation (min) (ACUTE ONLY): 27 min  Charges:                        Turner Daniels, SPT  12/21/2021, 12:53 PM

## 2021-12-21 NOTE — Care Management Important Message (Signed)
Important Message  Patient Details  Name: Pamela Shaffer MRN: [id number] Date of Birth: [birth year]   Medicare Important Message Given:  N/A - LOS <3 / Initial given by admissions     [name] 12/21/2021, 9:32 AM

## 2021-12-21 NOTE — TOC Progression Note (Signed)
Transition of Care [facility]) - Progression Note    Patient Details  Name: Narya Luquin MRN: [id number] Date of Birth: [birth year]  Transition of Care [facility]) CM/SW [place], RN Phone Number: 12/21/2021, 12:05 PM  Clinical Narrative:     Spoke with the daughter in the room, she is aware of the patient discharging today to [facility] room [id number] called, she is  Next on the list Expected Discharge Plan: [place] Barriers to Discharge: Continued Medical Work up  Expected Discharge Plan and Services Expected Discharge Plan: [place] arrangements for the past 2 months: Single Family Home Expected Discharge Date: 12/21/21               DME Arranged: N/A         HH Arranged: NA HH Agency: NA         Social Determinants of Health (SDOH) Interventions    Readmission Risk Interventions     View : No data to display.

## 2021-12-21 NOTE — TOC Progression Note (Signed)
Transition of Care [facility]) - Progression Note    Patient Details  Name: Pamela Shaffer MRN: [id number] Date of Birth: 29-Jun-1926  Transition of Care [facility]) CM/SW Contact  Marlowe Sax, RN Phone Number: 12/21/2021, 9:04 AM  Clinical Narrative:     Spoke to the patient's daughter on the phone and reviewed the bed offers, She accepted the bed offer  from [facility], I notified Sue Lush,        Expected Discharge Plan and Services                                                 Social Determinants of Health (SDOH) Interventions    Readmission Risk Interventions     View : No data to display.

## 2021-12-21 NOTE — Discharge Summary (Signed)
Physician Discharge Summary   Patient: Pamela Shaffer MRN: [id number] DOB: 08/13/1925  Admit date:     12/18/2021  Discharge date: 12/21/21  Discharge Physician: Arnetha CourserSumayya Glenmore Karl   PCP: Gracelyn NurseJohnston, John D, MD   Recommendations at discharge:  Please obtain CBC and BMP in 1 week Follow-up with orthopedic surgery in 1 to 2 weeks Follow-up with primary care provider  Discharge Diagnoses: Principal Problem:   Closed right hip fracture (HCC) Active Problems:   Acute blood loss anemia (ABLA)   Essential hypertension   Hyperlipidemia   GERD (gastroesophageal reflux disease)   Osteoporosis   Malnutrition of moderate degree   Hospital Course: Ms. Pamela CalkinLillie Shaffer is a 88 year old female with history of hypertension, hyperlipidemia, GERD, who presents emergency department for chief concerns of mechanical fall and right hip pain.  Initial vitals in the emergency department show temperature of 98.2, respiration rate of 14, heart rate of 69, blood pressure 161/64.  Serum sodium is 140, potassium 3.6, chloride of 105, bicarb of 29, BUN of 26, serum creatinine of 0.74, GFR greater than 60, nonfasting blood glucose 132, WBC 6.3, hemoglobin 11.5, platelets of 157.  Imaging: Right hip with pelvis x-ray was read as comminuted angulated right hip fracture through the intertrochanteric region without dislocation.  No other acute abnormalities.  ED treatment: Fentanyl 50 mcg IV one-time dose.  6/5: S/p right intramedullary nail placement by orthopedic surgery on 12/19/2021.  Tolerated the procedure well.  No significant blood loss noted but hemoglobin dropped to 6.5 this morning, it was 11.5 on admission and 8.2 next day.  Most likely significant bleeding with hip fracture. 2 unit of PRBC ordered due to the significant drop.  Anemia panel with some iron deficiency. Blood bank is giving hard time giving 2 units, which is required due to active bleeding and a significant drop in hemoglobin in 1 day.  PT/OT are  recommending SNF.  6/6: Hemoglobin improved to 8.8 after getting 2 unit of PRBC yesterday.  CT hip with a small hematoma.  Does not match with the hemoglobin drop, most likely blood loss during surgery. Anemia panel with anemia of chronic disease along with some iron deficiency.  Started on iron supplement. Patient is being discharged to rehab for further recommendations. She will need a monitoring of her hemoglobin. She will follow-up with her orthopedic surgeon for further recommendations in terms of her hip fracture and postsurgical care. Patient has very high risk for mortality in 1 year based on her age, prior comorbidities and recent hip fracture.  She will continue with current medications and follow-up with her providers.  Assessment and Plan: * Closed right hip fracture (HCC) - Presumed secondary to mechanical fall - Pain control: Norco 5-325 mg, 1 to 2 tablets every 6 hours.  For moderate pain, morphine 0.5 mg IV every 2 hours as needed for severe pain - Plan for operative repair today.  Awaiting post op recommendations.   Acute blood loss anemia (ABLA) Patient developed acute blood loss anemia although no mentioning of significant blood loss during surgery.  Patient can have a significant hematoma with hip fracture.  Hemoglobin dropped to 6.5 this morning, it was 11.5 on admission with a decreased to 8.2 yesterday.  Hemoglobin was 12.5 in December 2022. Anemia panel with some iron deficiency. CT hip with only small hematoma. -Ordered 2 unit of PRBC-patient needs at least 2 units with a drop of about 5 units within 48 hours.   -Monitor hemoglobin -DVT prophylaxis was discontinued by orthopedic surgery-we  will resume once hemoglobin stabilizes. -We will start iron supplement from tomorrow  Essential hypertension - Amlodipine 10 mg daily resumed  Hyperlipidemia - Pravastatin 20 mg daily resumed  GERD (gastroesophageal reflux disease) - PPI resumed  Osteoporosis Patient  had hip fracture with a ground-level fall which makes her osteoporotic. -Outpatient follow-up with PCP     Pain control - [place] Controlled Substance Reporting System database was reviewed. and patient was instructed, not to drive, operate heavy machinery, perform activities at heights, swimming or participation in water activities or provide baby-sitting services while on Pain, Sleep and Anxiety Medications; until their outpatient Physician has advised to do so again. Also recommended to not to take more than prescribed Pain, Sleep and Anxiety Medications.   Consultants: Orthopedic surgery Procedures performed: Right ORIF Disposition: Skilled nursing facility Diet recommendation:  Discharge Diet Orders (From admission, onward)     Start     Ordered   12/21/21 0000  Diet - low sodium heart healthy        12/21/21 1120           Cardiac diet DISCHARGE MEDICATION: Allergies as of 12/21/2021   No Known Allergies      Medication List     TAKE these medications    amLODipine 10 MG tablet Commonly known as: NORVASC Take 10 mg by mouth daily.   aspirin EC 81 MG tablet Take 1 tablet (81 mg total) by mouth daily. Swallow whole.   cyanocobalamin 1000 MCG tablet Take 1,000 mcg by mouth daily.   diclofenac 50 MG EC tablet Commonly known as: VOLTAREN Take 75 mg by mouth 2 (two) times daily.   docusate sodium 100 MG capsule Commonly known as: COLACE Take 1 capsule (100 mg total) by mouth 2 (two) times daily.   feeding supplement Liqd Take 237 mLs by mouth 3 (three) times daily between meals.   ferrous sulfate 325 (65 FE) MG tablet Take 1 tablet (325 mg total) by mouth 2 (two) times daily with a meal.   HYDROcodone-acetaminophen 5-325 MG tablet Commonly known as: NORCO/VICODIN Take 1-2 tablets by mouth every 6 (six) hours as needed for moderate pain.   lovastatin 20 MG tablet Commonly known as: MEVACOR Take 20 mg by mouth daily with supper.   multivitamin with  minerals Tabs tablet Take 1 tablet by mouth daily. Start taking on: December 22, 2021   omeprazole 20 MG capsule Commonly known as: PRILOSEC Take 20 mg by mouth daily with supper.   polyethylene glycol 17 g packet Commonly known as: MIRALAX / GLYCOLAX Take 17 g by mouth daily as needed for mild constipation.               Discharge Care Instructions  (From admission, onward)           Start     Ordered   12/21/21 0000  Leave dressing on - Keep it clean, dry, and intact until clinic visit        12/21/21 1120            Contact information for follow-up providers     Gracelyn Nurse, MD. Schedule an appointment as soon as possible for a visit in 1 week(s).   Specialty: Internal Medicine Contact information: [address] [place] [address] [phone]         Lyndle Herrlich, MD. Schedule an appointment as soon as possible for a visit in 1 week(s).   Specialty: Orthopedic Surgery Contact information: [phone] [facility]  [place] [address] [phone]              Contact information for after-discharge care     Destination     [facility] .   Service: Skilled Nursing Contact information: [address] [place] [address] [phone]                    Discharge Exam: Ceasar Mons Weights   12/18/21 1443  Weight: 57.6 kg   General.  Frail elderly lady, in no acute distress. Pulmonary.  Lungs clear bilaterally, normal respiratory effort. CV.  Regular rate and rhythm, no JVD, rub or murmur. Abdomen.  Soft, nontender, nondistended, BS positive. CNS.  Alert and oriented .  No focal neurologic deficit. Extremities.  No edema, no cyanosis, pulses intact and symmetrical. Psychiatry.  Judgment and insight appears normal.   Condition at discharge: stable  The results of significant diagnostics from this hospitalization (including imaging, microbiology, ancillary and laboratory) are listed  below for reference.   Imaging Studies: DG Chest 1 View  Result Date: 12/18/2021 CLINICAL DATA:  Fall.  Preoperative study. EXAM: CHEST  1 VIEW COMPARISON:  April 04, 2018 FINDINGS: Right shoulder replacement. The heart, hila, and mediastinum are unremarkable. No pneumothorax. No nodules or masses. No focal infiltrates. IMPRESSION: No active disease. Electronically Signed   By: Gerome Sam III M.D.   On: 12/18/2021 15:59   CT HEAD WO CONTRAST ( )  Result Date: 12/18/2021 CLINICAL DATA:  Head trauma, minor (Age >= 65y) EXAM: CT HEAD WITHOUT CONTRAST TECHNIQUE: Contiguous axial images were obtained from the base of the skull through the vertex without intravenous contrast. RADIATION DOSE REDUCTION: This exam was performed according to the departmental dose-optimization program which includes automated exposure control, adjustment of the mA and/or kV according to patient size and/or use of iterative reconstruction technique. COMPARISON:  CT dated May 18, 2012 FINDINGS: Brain: No evidence of acute infarction, hemorrhage, hydrocephalus, extra-axial collection or mass lesion/mass effect. Periventricular white matter hypodensities consistent with sequela of chronic microvascular ischemic disease. Vascular: No hyperdense vessel or unexpected calcification. Skull: Normal. Negative for fracture or focal lesion. Sinuses/Orbits: No acute finding. Other: None. IMPRESSION: No acute intracranial abnormality. Electronically Signed   By: Meda Klinefelter M.D.   On: 12/18/2021 15:37   CT HIP RIGHT WO CONTRAST  Result Date: 12/20/2021 CLINICAL DATA:  Hip trauma, drop in hemoglobin. EXAM: CT OF THE RIGHT HIP WITHOUT CONTRAST TECHNIQUE: Multidetector CT imaging of the right hip was performed according to the standard protocol. Multiplanar CT image reconstructions were also generated. RADIATION DOSE REDUCTION: This exam was performed according to the departmental dose-optimization program which includes  automated exposure control, adjustment of the mA and/or kV according to patient size and/or use of iterative reconstruction technique. COMPARISON:  None Available. FINDINGS: Bones/Joint/Cartilage Comminuted right intertrochanteric fracture transfixed with a intramedullary nail and interlocking femoral neck screw. 2 cm of medial displacement of the lesser trochanter. Mild osteoarthritis of the right hip. 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 Old healed right inferior pubic ramus fracture. Degenerative changes of the pubic symphysis. Normal alignment. No joint effusion. Ligaments Ligaments are suboptimally evaluated by CT. Muscles and Tendons Soft tissue emphysema in the right gluteus medius muscle and vastus lateralis muscle secondary to recent instrumentation. No muscle atrophy. Soft tissue Soft tissue edema and emphysema in the subcutaneous fat overlying the right greater trochanter consistent with recent right intertrochanteric fracture ORIF. Small 11 mm hematoma in the subcutaneous  fat. No soft tissue mass. IMPRESSION: 1. Comminuted right intertrochanteric fracture transfixed with a intramedullary nail and interlocking femoral neck screw. 2. No large hematoma in the subcutaneous fat or within the muscles to explain the patient's drop in hemoglobin. Electronically Signed   By: Elige Ko M.D.   On: 12/20/2021 13:31   DG C-Arm 1-60 Min-No Report  Result Date: 12/19/2021 Fluoroscopy was utilized by the requesting physician.  No radiographic interpretation.   DG HIP UNILAT WITH PELVIS 2-3 VIEWS RIGHT  Result Date: 12/19/2021 CLINICAL DATA:  Intertrochanteric right hip fracture. EXAM: DG HIP (WITH OR WITHOUT PELVIS) 2-3V RIGHT COMPARISON:  None Available. FINDINGS: Multiple intraoperative spot radiographs show placement of a intramedullary nail and rod transfixing an intertrochanteric fracture in near anatomic alignment. IMPRESSION: Internal fixation of intertrochanteric fracture in near anatomic  alignment. Electronically Signed   By: Danae Orleans M.D.   On: 12/19/2021 16:00   DG HIP UNILAT W OR W/O PELVIS 2-3 VIEWS RIGHT  Result Date: 12/18/2021 CLINICAL DATA:  Fall. EXAM: DG HIP (WITH OR WITHOUT PELVIS) 2-3V RIGHT COMPARISON:  CT of the abdomen and pelvis June 03, 2020 FINDINGS: There is a comminuted angulated right hip fracture through the intertrochanteric region without dislocation. There is a healed right inferior pubic ramus fracture, unchanged since the comparison CT scan. Degenerative changes in the left hip. No other abnormalities. IMPRESSION: Comminuted angulated right hip fracture through the intertrochanteric region without dislocation. No other acute abnormalities. Electronically Signed   By: Gerome Sam III M.D.   On: 12/18/2021 15:58    Microbiology: Results for orders placed or performed during the hospital encounter of 12/18/21  Surgical PCR screen     Status: Abnormal   Collection Time: 12/19/21  8:06 AM   Specimen: Nasal Mucosa; Nasal Swab  Result Value Ref Range Status   MRSA, PCR NEGATIVE NEGATIVE Final   Staphylococcus aureus POSITIVE (A) NEGATIVE Final    Comment: (NOTE) The Xpert SA Assay (FDA approved for NASAL specimens in patients 8 years of age and older), is one component of a comprehensive surveillance program. It is not intended to diagnose infection nor to guide or monitor treatment. Performed at [facility], [address] [place], [place] [address]     Labs: CBC: Recent Labs  Lab 12/18/21 1512 12/19/21 0444 12/20/21 0958 12/21/21 0616  WBC 6.3 7.3 11.0* 9.5  HGB 11.5* 8.2* 6.5* 8.8*  HCT 35.9* 25.3* 19.9* 25.2*  MCV 93.7 93.4 92.6 87.5  PLT 157 125* 133* 103*   Basic Metabolic Panel: Recent Labs  Lab 12/18/21 1512 12/19/21 0444 12/21/21 0616  NA 140 140 137  K 3.6 3.9 3.8  CL 105 109 103  CO2 29 28 28   GLUCOSE 132* 114* 113*  BUN 26* 27* 24*  CREATININE 0.74 0.68 0.66  CALCIUM 9.4 8.7* 8.4*   Liver  Function Tests: Recent Labs  Lab 12/18/21 1512  AST 21  ALT 12  ALKPHOS 43  BILITOT 1.1  PROT 6.6  ALBUMIN 4.0   CBG: No results for input(s): GLUCAP in the last 168 hours.  Discharge time spent: greater than 30 minutes.  This record has been created using 02/17/22. Errors have been sought and corrected,but may not always be located. Such creation errors do not reflect on the standard of care.   Signed: Conservation officer, historic buildings, MD [facility] 12/21/2021

## 2021-12-24 DIAGNOSIS — M10041 Idiopathic gout, right hand: Secondary | ICD-10-CM | POA: Diagnosis not present

## 2021-12-24 DIAGNOSIS — M10031 Idiopathic gout, right wrist: Secondary | ICD-10-CM | POA: Diagnosis not present

## 2021-12-29 DIAGNOSIS — B372 Candidiasis of skin and nail: Secondary | ICD-10-CM | POA: Diagnosis not present

## 2022-01-17 DIAGNOSIS — I10 Essential (primary) hypertension: Secondary | ICD-10-CM | POA: Diagnosis not present

## 2022-01-17 DIAGNOSIS — M159 Polyosteoarthritis, unspecified: Secondary | ICD-10-CM

## 2022-01-17 DIAGNOSIS — D62 Acute posthemorrhagic anemia: Secondary | ICD-10-CM | POA: Diagnosis not present

## 2022-01-17 DIAGNOSIS — S72001S Fracture of unspecified part of neck of right femur, sequela: Secondary | ICD-10-CM | POA: Diagnosis not present

## 2022-01-17 DIAGNOSIS — K219 Gastro-esophageal reflux disease without esophagitis: Secondary | ICD-10-CM | POA: Diagnosis not present

## 2022-07-15 IMAGING — CT CT HIP*R* W/O CM
2 of 6 series · 13 of 46 positions shown, 18 images · non-contrast
Comparison: None Available.

CLINICAL DATA: Hip trauma, drop in hemoglobin.

EXAM:
CT OF THE RIGHT HIP WITHOUT CONTRAST
TECHNIQUE: Multidetector CT imaging of the right hip was performed according to
the standard protocol. Multiplanar CT image reconstructions were
also generated.
RADIATION DOSE REDUCTION: This exam was performed according to the
departmental dose-optimization program which includes automated
exposure control, adjustment of the mA and/or kV according to
patient size and/or use of iterative reconstruction technique.

[Series 3: axial st · axial · 0.49mm/px · z∈[-1247,-1065]mm · 10 of 105 slices shown, 15 images]
[im 7/105  soft-tissue]
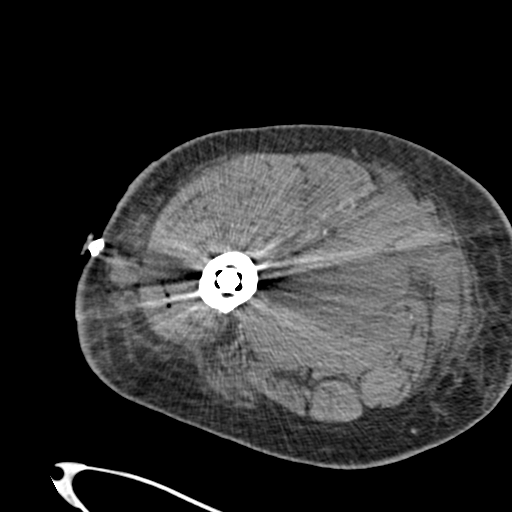
[im 7/105  bone]
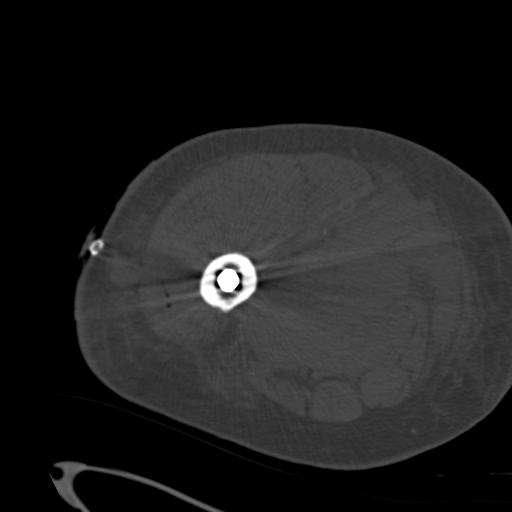
[im 21/105  soft-tissue]
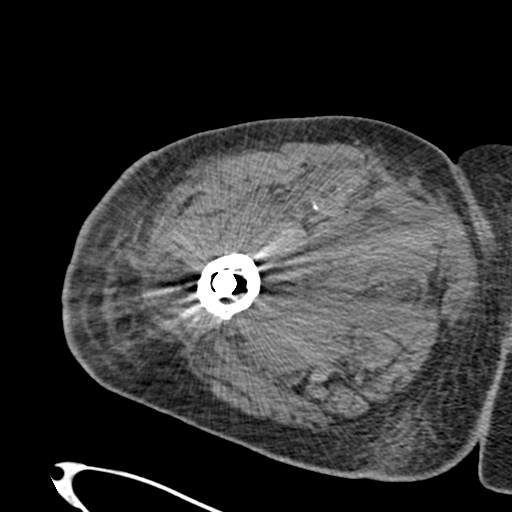
[im 28/105  soft-tissue]
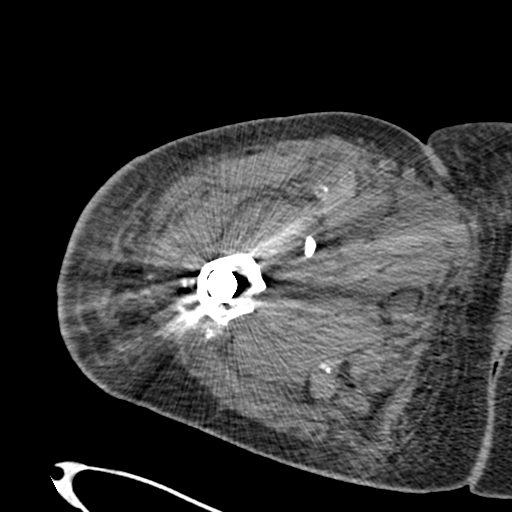
[im 42/105  soft-tissue]
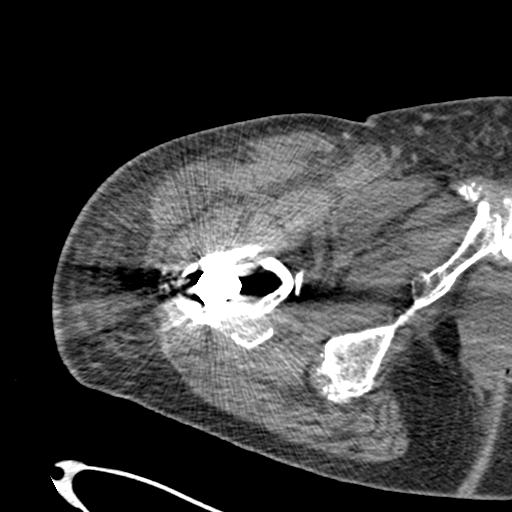
[im 56/105  soft-tissue]
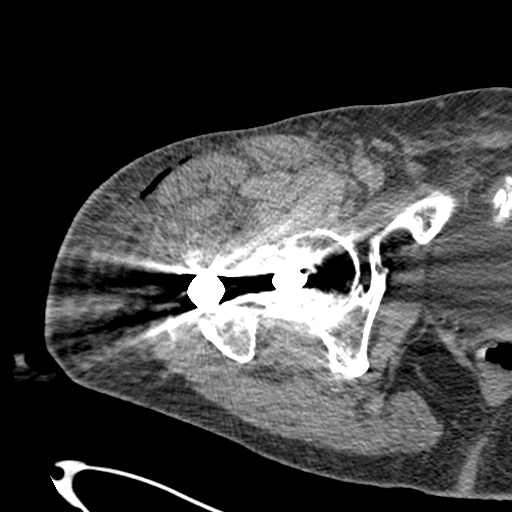
[im 63/105  soft-tissue]
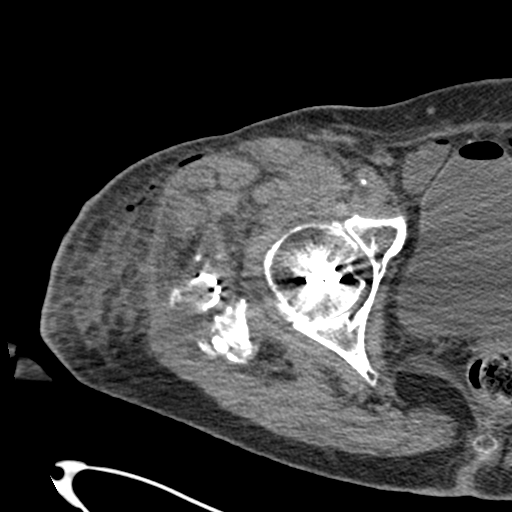
[im 77/105  soft-tissue]
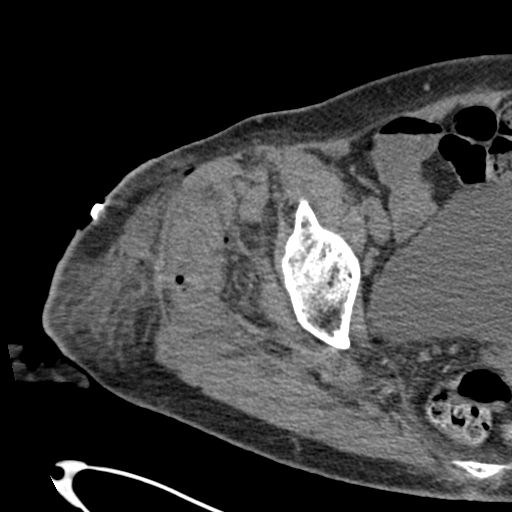
[im 77/105  lung]
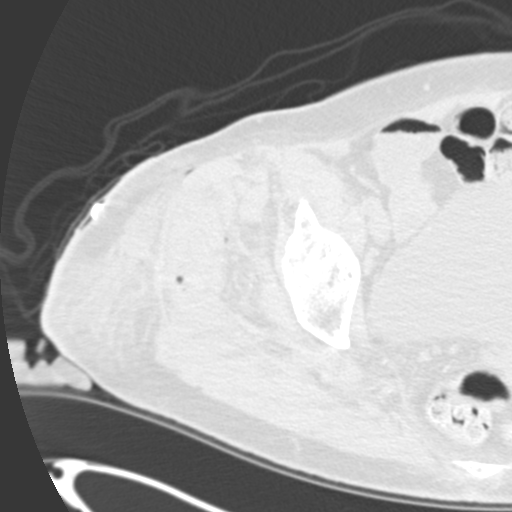
[im 84/105  soft-tissue]
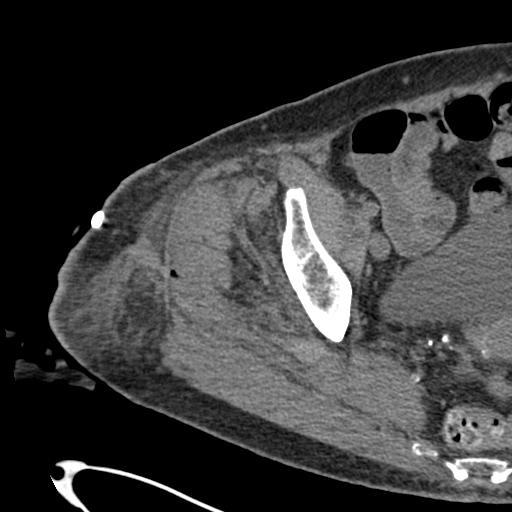
[im 84/105  lung]
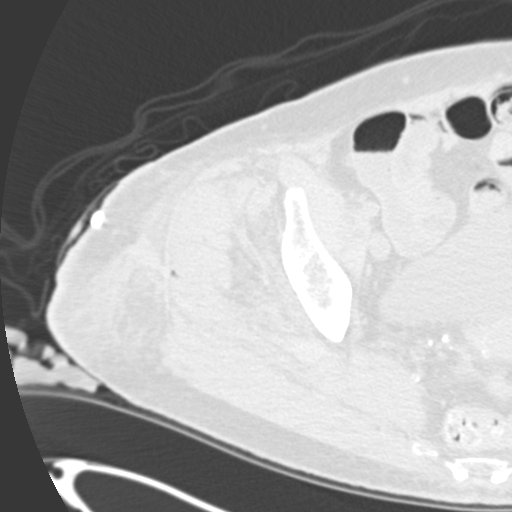
[im 91/105  lung]
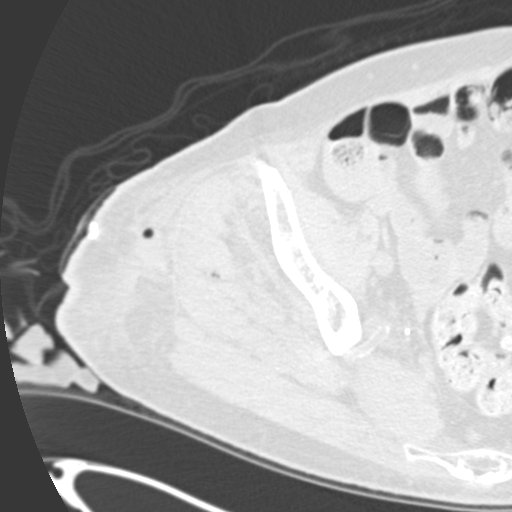
[im 98/105  soft-tissue]
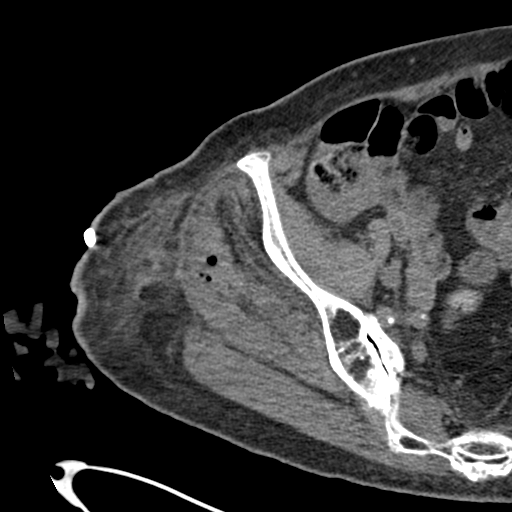
[im 98/105  lung]
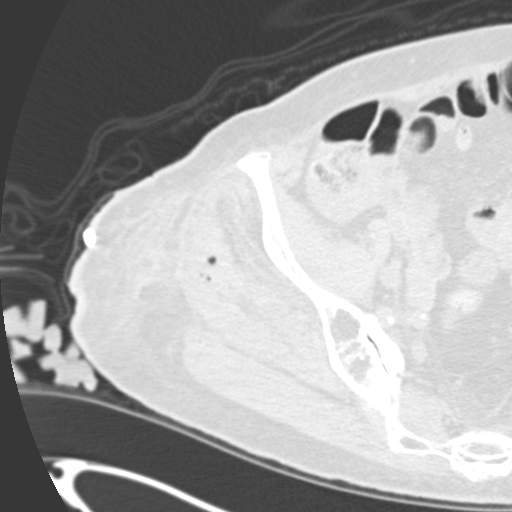
[im 98/105  bone]
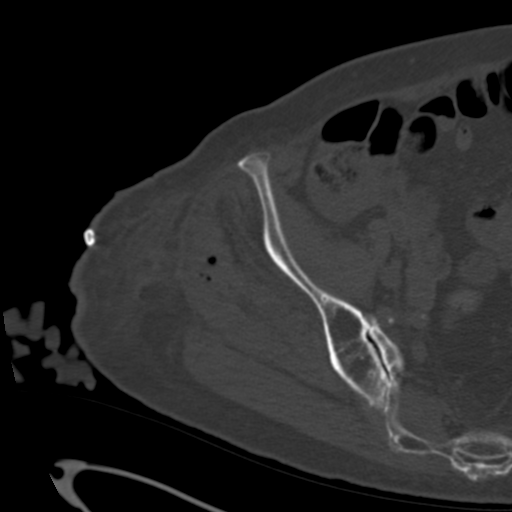

[Series 5: coronal st · coronal · 0.43mm/px · 3 of 109 slices shown]
[im 22/109  soft-tissue]
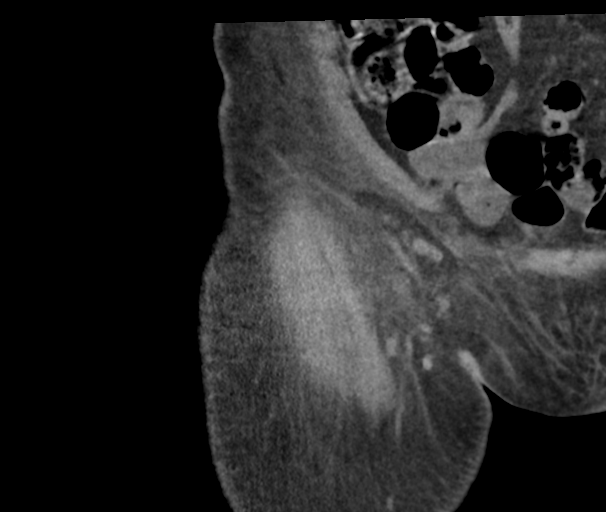
[im 44/109  soft-tissue]
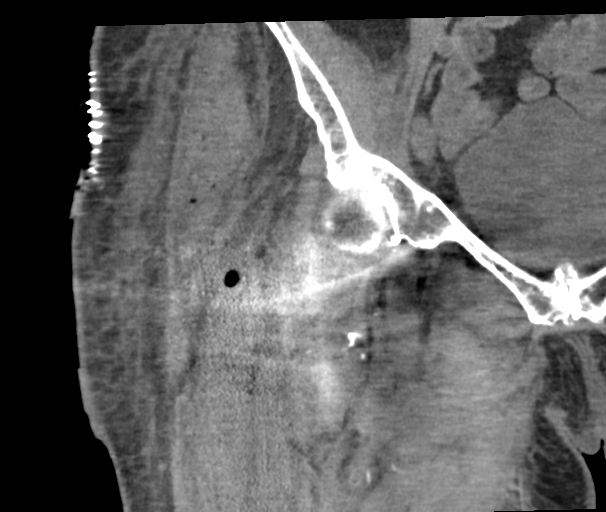
[im 65/109  soft-tissue]
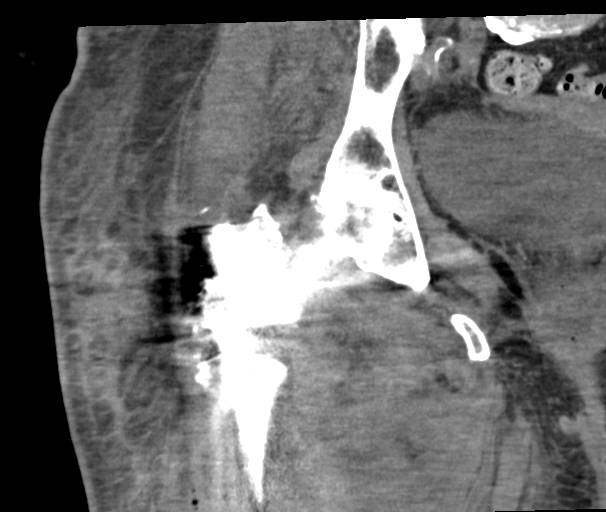

[13 of 46 positions shown; findings below may reference images not displayed]

FINDINGS: Bones/Joint/Cartilage

Comminuted right intertrochanteric fracture transfixed with a
intramedullary nail and interlocking femoral neck screw. 2 cm of
medial displacement of the lesser trochanter.

Mild osteoarthritis of the right hip.

Moderate osteoarthritis of the right SI joint. Old healed right
inferior pubic ramus fracture. Degenerative changes of the pubic
symphysis.

Normal alignment. No joint effusion.

Ligaments

Ligaments are suboptimally evaluated by CT.

Muscles and Tendons
Soft tissue emphysema in the right gluteus medius muscle and vastus
lateralis muscle secondary to recent instrumentation. No muscle
atrophy.

Soft tissue
Soft tissue edema and emphysema in the subcutaneous fat overlying
the right greater trochanter consistent with recent right
intertrochanteric fracture ORIF. Small 11 mm hematoma in the
subcutaneous fat. No soft tissue mass.
IMPRESSION: 1. Comminuted right intertrochanteric fracture transfixed with a
intramedullary nail and interlocking femoral neck screw.
2. No large hematoma in the subcutaneous fat or within the muscles
to explain the patient's drop in hemoglobin.

## 2023-08-14 ENCOUNTER — Emergency Department: Payer: Medicare Other

## 2023-08-14 ENCOUNTER — Emergency Department
Admission: EM | Admit: 2023-08-14 | Discharge: 2023-08-14 | Disposition: A | Discharge status: Home / Self Care | Payer: Medicare Other | Attending: Emergency Medicine | Admitting: Emergency Medicine

## 2023-08-14 ENCOUNTER — Other Ambulatory Visit: Payer: Self-pay

## 2023-08-14 DIAGNOSIS — S0990XA Unspecified injury of head, initial encounter: Secondary | ICD-10-CM | POA: Diagnosis present

## 2023-08-14 DIAGNOSIS — M25511 Pain in right shoulder: Secondary | ICD-10-CM | POA: Diagnosis not present

## 2023-08-14 DIAGNOSIS — Y92000 Kitchen of unspecified non-institutional (private) residence as  the place of occurrence of the external cause: Secondary | ICD-10-CM | POA: Insufficient documentation

## 2023-08-14 DIAGNOSIS — W19XXXA Unspecified fall, initial encounter: Secondary | ICD-10-CM | POA: Insufficient documentation

## 2023-08-14 DIAGNOSIS — M79644 Pain in right finger(s): Secondary | ICD-10-CM | POA: Insufficient documentation

## 2023-08-14 DIAGNOSIS — I10 Essential (primary) hypertension: Secondary | ICD-10-CM | POA: Diagnosis not present

## 2023-08-14 MED ORDER — ACETAMINOPHEN 325 MG PO TABS
650.0000 mg | ORAL_TABLET | Freq: Once | ORAL | Status: AC
  Administered 2023-08-14: 650 mg via ORAL
  Filled 2023-08-14: qty 2

## 2023-08-14 NOTE — ED Triage Notes (Signed)
Pt to ED via [facility] from home c/o fall. Pt was in kitchen with walker when she fell. Pt thinks the uneven tiles caught on her walker, making her trip. Pt denies LOC and denies hitting head. Denies dizziness prior to/after fall. Pt endorses right shoulder pain and right ring finger pain.

## 2023-08-14 NOTE — Discharge Instructions (Signed)
The CT scan of your head and neck was normal today.  Your right shoulder x-ray was normal.  Your right hand x-ray showed degenerative changes but did not show any fractures.  You can take Tylenol and the diclofenac as needed for pain.  You can also try muscle creams as well as ice and heat.  Please return to the ED with any new or worsening symptoms.

## 2023-08-14 NOTE — ED Provider Notes (Signed)
[facility] Provider Note    Event Date/Time   First MD Initiated Contact with Patient 08/14/23 2142     (approximate)   History   Fall   HPI  Pamela Shaffer is a 89 y.o. female with PMH of hypertension, arthritis and osteoporosis presents for evaluation after a fall at home.  Patient states she was in the kitchen with her walker when she tripped and fell.  She thinks the walker may have caught on an uneven tile.  She denies hitting her head and did not have LOC.  No dizziness before the fall.  She reports pain to her right shoulder and right ring finger.      Physical Exam   Triage Vital Signs: ED Triage Vitals  Encounter Vitals Group     BP 08/14/23 1925 (!) 158/64     Systolic BP Percentile --      Diastolic BP Percentile --      Pulse Rate 08/14/23 1925 92     Resp 08/14/23 1925 16     Temp 08/14/23 1925 98.2 F (36.8 C)     Temp Source 08/14/23 1925 Oral     SpO2 08/14/23 1925 95 %     Weight 08/14/23 1925 127 lb (57.6 kg)     Height 08/14/23 1925 5\' 1"  (1.549 m)     Head Circumference --      Peak Flow --      Pain Score 08/14/23 1933 5     Pain Loc --      Pain Education --      Exclude from Growth Chart --     Most recent vital signs: Vitals:   08/14/23 1925  BP: (!) 158/64  Pulse: 92  Resp: 16  Temp: 98.2 F (36.8 C)  SpO2: 95%   General: Awake, no distress.  CV:  Good peripheral perfusion. RRR. Murmur present. Resp:  Normal effort.  CTAB. Abd:  No distention.  R Shoulder: Mild tenderness to palpation over the anterior and posterior shoulder, no tenderness over the collarbone.  Active ROM is limited but passive ROM maintained, strength is 5/5 when compared to the left side. Right hand: Right ring finger has bruising over the first IP joint and is swollen and tender to palpation, finger ROM maintained, radial pulse 2+ and regular. Neuro:  Left pupil is not round but does react to light.  Right pupil is round and does react to  light.  Pupils are not equal in size.  EOM intact.  No focal neurodeficits.  No ataxia.  ED Results / Procedures / Treatments   Labs (all labs ordered are listed, but only abnormal results are displayed) Labs Reviewed - No data to display   RADIOLOGY  CT head, cervical spine, right shoulder x-ray and right hand x-ray obtained, I interpreted the images as well as reviewed the radiologist report.  No acute intracranial abnormalities, no fractures of the cervical spine.  Right shoulder is s/p arthroplasty without fractures or dislocation.  Hand x-ray shows diffuse degenerative changes but no fractures.  PROCEDURES:  Critical Care performed: No  Procedures   MEDICATIONS ORDERED IN ED: Medications  acetaminophen (TYLENOL) tablet 650 mg (650 mg Oral Given 08/14/23 2218)     IMPRESSION / MDM / ASSESSMENT AND PLAN / ED COURSE  I reviewed the triage vital signs and the nursing notes.  89 year old female presents for evaluation after a fall.  Patient was hypertensive but does have history of hypertension, vital signs stable otherwise.  Patient NAD on exam.  Differential diagnosis includes, but is not limited to, intracranial bleed, skull fracture, closed head injury, shoulder fracture, shoulder dislocation, contusion, abrasion.  Patient's presentation is most consistent with acute complicated illness / injury requiring diagnostic workup.  CT of the head and neck were negative.  Right shoulder x-ray shows shoulder arthroplasty without fractures.  Right hand x-ray is negative but does show degenerative changes.  Patient reports that range of motion in her right shoulder has been limited ever since her shoulder replacement.  Does not feel that the range of motion is significantly decreased from her norm.    Given the negative imaging and minimal pain on physical exam, I feel patient is stable for discharge.  She can take her Tylenol and diclofenac as previously  prescribed.  Patient can follow-up with primary care as needed.  She voiced understanding, all questions were answered and she is stable at discharge.      FINAL CLINICAL IMPRESSION(S) / ED DIAGNOSES   Final diagnoses:  Fall, initial encounter  Acute pain of right shoulder     Rx / DC Orders   ED Discharge Orders     None        Note:  This document was prepared using Dragon voice recognition software and may include unintentional dictation errors.   [name], PA-C 08/14/23 2220    [name], MD 08/14/23 2249

## 2023-08-14 NOTE — ED Provider Triage Note (Signed)
Emergency Medicine Provider Triage Evaluation Note  Pamela Shaffer , a 89 y.o. female  was evaluated in triage.  Pt complains of fall and shoulder pain. Patient describes having a mechanical fall. Reports pain in the right shoulder and hand. Did not hit her head, no LOC.  Review of Systems  Positive: Shoulder pain, hand pain Negative:   Physical Exam  BP (!) 158/64 (BP Location: Left Arm)   Pulse 92   Temp 98.2 F (36.8 C) (Oral)   Resp 16   Ht 5\' 1"  (1.549 m)   Wt 57.6 kg   SpO2 95%   BMI 24.00 kg/m  Gen:   Awake, no distress   Resp:  Normal effort  MSK:   Moves extremities without difficulty  Other:    Medical Decision Making  Medically screening exam initiated at 7:29 PM.  Appropriate orders placed.  Pamela Shaffer was informed that the remainder of the evaluation will be completed by another provider, this initial triage assessment does not replace that evaluation, and the importance of remaining in the ED until their evaluation is complete.     Pamela Ali, PA-C 08/14/23 1933

## 2024-07-07 ENCOUNTER — Emergency Department
Admission: EM | Admit: 2024-07-07 | Discharge: 2024-07-07 | Disposition: A | Discharge status: Home / Self Care | Attending: Emergency Medicine | Admitting: Emergency Medicine

## 2024-07-07 ENCOUNTER — Emergency Department

## 2024-07-07 ENCOUNTER — Other Ambulatory Visit: Payer: Self-pay

## 2024-07-07 DIAGNOSIS — W19XXXA Unspecified fall, initial encounter: Secondary | ICD-10-CM | POA: Insufficient documentation

## 2024-07-07 DIAGNOSIS — M25551 Pain in right hip: Secondary | ICD-10-CM | POA: Diagnosis present

## 2024-07-07 NOTE — ED Triage Notes (Signed)
 Pt to ED [facility] from [facility] for mechanical fall at 0615 today while putting socks on. No LOC, no dizziness, did not hit head. Pt was ambulatory with assistance on scene. Pt complains of R hip and buttock pain. Hx hip fx to same side. Sitting in wheelchair. Pt is a/o X4.   CBG 109, 98.1, hr 71, 177/65, 100% RA

## 2024-07-07 NOTE — ED Provider Notes (Signed)
 "  [facility] Provider Note    Event Date/Time   First MD Initiated Contact with Patient 07/07/24 0825     (approximate)   History   Fall and Hip Pain   HPI  Pamela Shaffer is a 89 y.o. female who presents to the emergency department today after a fall and right hip pain.  Patient states that she was bending over to pull up her socks.  She only had 1 hand on her walker.  She then fell over.  She is only complaining of pain to her right hip.  She denies hitting her head.  She denies any lightheadedness or dizziness.  No chest pain or palpitations.  She was able to get up and walk on her right leg with the aid of staff after the incident.     Physical Exam   Triage Vital Signs: ED Triage Vitals  Encounter Vitals Group     BP 07/07/24 0727 (!) 165/73     Girls Systolic BP Percentile --      Girls Diastolic BP Percentile --      Boys Systolic BP Percentile --      Boys Diastolic BP Percentile --      Pulse Rate 07/07/24 0727 70     Resp 07/07/24 0727 20     Temp 07/07/24 0727 97.8 F (36.6 C)     Temp Source 07/07/24 0727 Oral     SpO2 07/07/24 0727 94 %     Weight 07/07/24 0728 120 lb (54.4 kg)     Height 07/07/24 0728 5' 3 (1.6 m)     Head Circumference --      Peak Flow --      Pain Score 07/07/24 0727 9     Pain Loc --      Pain Education --      Exclude from Growth Chart --     Most recent vital signs: Vitals:   07/07/24 0727  BP: (!) 165/73  Pulse: 70  Resp: 20  Temp: 97.8 F (36.6 C)  SpO2: 94%   General: Awake, alert, oriented. CV:  Good peripheral perfusion. Regular rate and rhythm, systolic murmur. Resp:  Normal effort. Lungs clear. Abd:  No distention.  Other:  Right leg NV intact.    ED Results / Procedures / Treatments   Labs (all labs ordered are listed, but only abnormal results are displayed) Labs Reviewed - No data to display   EKG  None   RADIOLOGY I independently interpreted and visualized the right hip.  My interpretation: No fracture Radiology interpretation:  IMPRESSION:  1. No acute fracture.  2. Status post open reduction and internal fixation of the right femur with  intramedullary screw and interlocking rod.  3. Healed fracture of the right inferior pubic ramus.  4. Status post right total knee arthroplasty.  5. Moderate vascular calcifications.     PROCEDURES:  Critical Care performed: No   MEDICATIONS ORDERED IN ED: Medications - No data to display   IMPRESSION / MDM / ASSESSMENT AND PLAN / ED COURSE  I reviewed the triage vital signs and the nursing notes.                              Differential diagnosis includes, but is not limited to, fracture, dislocation, contusion  Patient's presentation is most consistent with acute presentation with potential threat to life or bodily function.  Patient presented to  the emergency department today after a fall when she lost her balance and right hip pain.  X-rays of the right hip with any fracture or dislocation.  Patient was able to ambulate after the incident.  At this time I think likely patient suffering from contusion.  Will plan on discharging.      FINAL CLINICAL IMPRESSION(S) / ED DIAGNOSES   Final diagnoses:  Fall, initial encounter  Right hip pain      Note:  This document was prepared using Dragon voice recognition software and may include unintentional dictation errors.    Floy Roberts, MD 07/07/24 [phone]  "
